# Patient Record
Sex: Female | Born: 1955 | Race: Black or African American | Hispanic: No | Smoking: Never smoker
Health system: Southern US, Community
[De-identification: ages and names within clinical notes are randomized; demographics above are authoritative.]

## PROBLEM LIST (undated history)

## (undated) DIAGNOSIS — F329 Major depressive disorder, single episode, unspecified: Secondary | ICD-10-CM

## (undated) DIAGNOSIS — R569 Unspecified convulsions: Secondary | ICD-10-CM

## (undated) DIAGNOSIS — I10 Essential (primary) hypertension: Secondary | ICD-10-CM

## (undated) DIAGNOSIS — F419 Anxiety disorder, unspecified: Secondary | ICD-10-CM

## (undated) DIAGNOSIS — E785 Hyperlipidemia, unspecified: Secondary | ICD-10-CM

## (undated) DIAGNOSIS — M25569 Pain in unspecified knee: Secondary | ICD-10-CM

## (undated) DIAGNOSIS — J45909 Unspecified asthma, uncomplicated: Secondary | ICD-10-CM

## (undated) DIAGNOSIS — F32A Depression, unspecified: Secondary | ICD-10-CM

## (undated) HISTORY — DX: Major depressive disorder, single episode, unspecified: F32.9

## (undated) HISTORY — DX: Hyperlipidemia, unspecified: E78.5

## (undated) HISTORY — PX: REDUCTION MAMMAPLASTY: SUR839

## (undated) HISTORY — DX: Depression, unspecified: F32.A

## (undated) HISTORY — DX: Anxiety disorder, unspecified: F41.9

## (undated) HISTORY — PX: BREAST SURGERY: SHX581

---

## 2006-08-12 ENCOUNTER — Ambulatory Visit: Payer: Self-pay | Admitting: Family Medicine

## 2006-08-15 ENCOUNTER — Ambulatory Visit: Payer: Self-pay | Admitting: Internal Medicine

## 2006-08-17 ENCOUNTER — Ambulatory Visit: Payer: Self-pay | Admitting: Nurse Practitioner

## 2006-08-18 ENCOUNTER — Ambulatory Visit: Payer: Self-pay | Admitting: *Deleted

## 2006-08-29 ENCOUNTER — Ambulatory Visit: Payer: Self-pay | Admitting: Nurse Practitioner

## 2007-07-26 ENCOUNTER — Encounter (INDEPENDENT_AMBULATORY_CARE_PROVIDER_SITE_OTHER): Payer: Self-pay | Admitting: *Deleted

## 2009-09-11 ENCOUNTER — Emergency Department (HOSPITAL_COMMUNITY): Admission: EM | Admit: 2009-09-11 | Discharge: 2009-09-11 | Payer: Self-pay | Admitting: Emergency Medicine

## 2009-09-22 ENCOUNTER — Encounter: Payer: Self-pay | Admitting: Physician Assistant

## 2009-10-21 ENCOUNTER — Ambulatory Visit: Payer: Self-pay | Admitting: Physician Assistant

## 2009-10-21 DIAGNOSIS — R569 Unspecified convulsions: Secondary | ICD-10-CM | POA: Insufficient documentation

## 2009-10-27 LAB — CONVERTED CEMR LAB
ALT: 10 units/L (ref 0–35)
Alkaline Phosphatase: 74 units/L (ref 39–117)
Amphetamine Screen, Ur: NEGATIVE
Barbiturate Quant, Ur: NEGATIVE
Basophils Relative: 0 % (ref 0–1)
CO2: 28 meq/L (ref 19–32)
Cocaine Metabolites: NEGATIVE
HCT: 44.2 % (ref 36.0–46.0)
Hemoglobin: 14.5 g/dL (ref 12.0–15.0)
Lymphs Abs: 3.1 10*3/uL (ref 0.7–4.0)
MCV: 92.7 fL (ref 78.0–100.0)
Opiate Screen, Urine: NEGATIVE
Platelets: 284 10*3/uL (ref 150–400)
Potassium: 4.5 meq/L (ref 3.5–5.3)
RDW: 12.8 % (ref 11.5–15.5)
Sodium: 142 meq/L (ref 135–145)
TSH: 1.446 microintl units/mL (ref 0.350–4.500)
Total Bilirubin: 0.3 mg/dL (ref 0.3–1.2)
Total Protein: 8.2 g/dL (ref 6.0–8.3)

## 2009-11-23 ENCOUNTER — Telehealth: Payer: Self-pay | Admitting: Physician Assistant

## 2009-11-24 ENCOUNTER — Ambulatory Visit: Payer: Self-pay | Admitting: Physician Assistant

## 2009-11-25 LAB — CONVERTED CEMR LAB
BUN: 15 mg/dL (ref 6–23)
CO2: 30 meq/L (ref 19–32)
Calcium: 10.2 mg/dL (ref 8.4–10.5)
Creatinine, Ser: 0.86 mg/dL (ref 0.40–1.20)
Glucose, Bld: 142 mg/dL — ABNORMAL HIGH (ref 70–99)

## 2009-11-27 ENCOUNTER — Encounter: Payer: Self-pay | Admitting: Physician Assistant

## 2009-12-04 ENCOUNTER — Ambulatory Visit (HOSPITAL_COMMUNITY): Admission: RE | Admit: 2009-12-04 | Discharge: 2009-12-04 | Payer: Self-pay | Admitting: Internal Medicine

## 2009-12-09 ENCOUNTER — Encounter: Payer: Self-pay | Admitting: Physician Assistant

## 2009-12-10 ENCOUNTER — Encounter: Payer: Self-pay | Admitting: Physician Assistant

## 2009-12-10 ENCOUNTER — Ambulatory Visit: Payer: Self-pay | Admitting: Physician Assistant

## 2009-12-11 ENCOUNTER — Encounter: Admission: RE | Admit: 2009-12-11 | Discharge: 2009-12-11 | Payer: Self-pay | Admitting: Internal Medicine

## 2009-12-17 ENCOUNTER — Telehealth: Payer: Self-pay | Admitting: Physician Assistant

## 2010-01-06 ENCOUNTER — Telehealth: Payer: Self-pay | Admitting: Physician Assistant

## 2010-02-23 ENCOUNTER — Encounter: Payer: Self-pay | Admitting: Physician Assistant

## 2010-08-26 ENCOUNTER — Emergency Department (HOSPITAL_COMMUNITY): Admission: EM | Admit: 2010-08-26 | Discharge: 2010-08-26 | Payer: Self-pay | Admitting: Emergency Medicine

## 2010-12-08 NOTE — Assessment & Plan Note (Signed)
Summary: seizures/BMET/HIV TEST////cns   Vital Signs:  Patient profile:   55 year old female Height:      65 inches Weight:      207 pounds BMI:     34.57 Temp:     97.8 degrees F oral Pulse rate:   76 / minute Pulse rhythm:   regular Resp:     18 per minute BP sitting:   138 / 78  (left arm) Cuff size:   large  Vitals Entered By: Armenia Shannon (November 24, 2009 3:55 PM) CC: f/u on seiaures.... pt does need blood work...Marland KitchenMarland Kitchen pt says everytime her seizure meds change she gets really dizzy with blurry vision... pt says when the med gets in her system she is fine after a while... pt just wanted to let you know.. Is Patient Diabetic? No Pain Assessment Patient in pain? no       Does patient need assistance? Functional Status Self care Ambulation Normal   CC:  f/u on seiaures.... pt does need blood work...Marland KitchenMarland Kitchen pt says everytime her seizure meds change she gets really dizzy with blurry vision... pt says when the med gets in her system she is fine after a while... pt just wanted to let you know...  History of Present Illness: Here for f/u. No seizures since last visit.  Taking all meds.   [facility]  Wants to switch to our pharm. She was told she could get Depakote ER for free but does not want to change.  She feels dizzy and lethargic whenever she changes her meds. No complaints today. She thinks she had a pap last year in [place].  No mammo in over a year.  She would like to get updated vaccines today. Creatinine last time was a little high.  She is on HCTZ for BP.  Current Medications (verified): 1)  Depakote 500 Mg Tbec (Divalproex Sodium) .... Take 1 Tablet By Mouth Two Times A Day 2)  Tegretol Xr 400 Mg Xr12h-Tab (Carbamazepine) .... Take 1 Tablet By Mouth Two Times A Day 3)  Hydrochlorothiazide 25 Mg Tabs (Hydrochlorothiazide) .... Take 1 Tablet By Mouth Once A Day  Allergies (verified): No Known Drug Allergies  Physical Exam  General:  alert,  well-developed, and well-nourished.   Head:  normocephalic and atraumatic.   Lungs:  normal breath sounds, no crackles, and no wheezes.   Heart:  normal rate and regular rhythm.   Neurologic:  alert & oriented X3 and cranial nerves II-XII intact.   Psych:  normally interactive.     Impression & Recommendations:  Problem # 1:  PREVENTIVE HEALTH CARE (ICD-V70.0)  get record of pap schedule mammo update vaccines schedule CPE/CPP  Orders: T-HIV Antibody  (Reflex) ([id number]) Mammogram (Screening) (Mammo)  Problem # 2:  HYPERTENSION (ICD-401.9)  creat sl high last time check bmet if up more, consider changing med  Her updated medication list for this problem includes:    Hydrochlorothiazide 25 Mg Tabs (Hydrochlorothiazide) .Marland Kitchen... Take 1 tablet by mouth once a day  Orders: T-Basic Metabolic Panel [phone])  Problem # 3:  SEIZURE DISORDER (ICD-780.39)  stable no seizures does not want to change meds wants to go to our pharmacy for meds will fax Rx  Her updated medication list for this problem includes:    Depakote 500 Mg Tbec (Divalproex sodium) .Marland Kitchen... Take 1 tablet by mouth two times a day    Tegretol Xr 400 Mg Xr12h-tab (Carbamazepine) .Marland Kitchen... Take 1 tablet by mouth two times a day  Orders: T-Tegretol (Carbamazepine) [phone]) T-Valproic Acid (Depakene) ([id number])  Complete Medication List: 1)  Depakote 500 Mg Tbec (Divalproex sodium) .... Take 1 tablet by mouth two times a day 2)  Tegretol Xr 400 Mg Xr12h-tab (Carbamazepine) .... Take 1 tablet by mouth two times a day 3)  Hydrochlorothiazide 25 Mg Tabs (Hydrochlorothiazide) .... Take 1 tablet by mouth once a day  Patient Instructions: 1)  Flu shot today. 2)  Tetanus shot today. 3)  Sign form to get records from [facility] in [place], [place].  Need copy of last pap smear. 4)  Please schedule a follow-up appointment in 3 months for CPE.  Prescriptions: DEPAKOTE 500 MG TBEC (DIVALPROEX  SODIUM) Take 1 tablet by mouth two times a day  #60 x 5   Entered and Authorized by:   Tereso Newcomer PA-C   Signed by:   Tereso Newcomer PA-C on 11/24/2009   Method used:   Faxed to ...       [facility] - [facility] (retail)       [address] [place], [place]  [address]       Ph: [phone]       Fax: [phone]   RxID:   [id number] TEGRETOL XR 400 MG XR12H-TAB (CARBAMAZEPINE) Take 1 tablet by mouth two times a day  #60 x 5   Entered and Authorized by:   Tereso Newcomer PA-C   Signed by:   Tereso Newcomer PA-C on 11/24/2009   Method used:   Faxed to ...       [facility] - Pharmac (retail)       [address] [place], [place]  [address]       Ph: [phone]       Fax: [phone]   RxID:   [id number] HYDROCHLOROTHIAZIDE 25 MG TABS (HYDROCHLOROTHIAZIDE) Take 1 tablet by mouth once a day  #30 x 5   Entered and Authorized by:   Tereso Newcomer PA-C   Signed by:   Tereso Newcomer PA-C on 11/24/2009   Method used:   Faxed to ...       [facility] - Pharmac (retail)       [address] [place], [place]  [address]       Ph: [phone]       Fax: [phone]   RxID:   [id number]   Appended Document: seizures/BMET/HIV TEST////cns   Tetanus/Td Vaccine    Vaccine Type: Tdap    Site: left deltoid    Mfr: Sanofi Pasteur    Dose: 0.5 ml    Route: IM    Given by: Armenia Shannon    Exp. Date: 01/29/2012    Lot #: [id number]    VIS given: 09/26/07 version given November 24, 2009.  Pneumovax Vaccine    Vaccine Type: Pneumovax    Site: left deltoid    Mfr: Merck    Dose: 0.5 ml    Route: IM    Given by: Armenia Shannon    Exp. Date: 12/05/2010    Lot #: [id number]    VIS given: 06/05/96 version given November 24, 2009.   Appended Document: seizures/BMET/HIV TEST////cns  Laboratory Results  Date/Time Received: November 24, 2009 5:00 PM   Other Tests  Rapid HIV:  negative

## 2010-12-08 NOTE — Letter (Signed)
Summary: [facility]   MAP   Imported By: Arta Bruce 01/09/2010 15:18:28  _____________________________________________________________________  External Attachment:    Type:   Image     Comment:   External Document

## 2010-12-08 NOTE — Progress Notes (Signed)
   Phone Note Call from Patient Call back at Home Phone [phone]   Reason for Call: Referral Summary of Call: the pt has some problem with her right ankle ( hardly can walk) and she wants to know if the provder can suggest her something over the counter. [facility] [phone] Alben Spittle PA-c Initial call taken by: Manon Hilding,  January 06, 2010 9:25 AM  Follow-up for Phone Call        spoke with pt and she fell down and think she sprung her leg.... pt says she has been putting pressure to it and been soaking her foot.... pt says she has been trying to walk on the foot alittle and elevating the foot... pt says she fell last night... pt would like to know what else she can do to help Follow-up by: Armenia Shannon,  January 06, 2010 2:14 PM  Additional Follow-up for Phone Call Additional follow up Details #1::        RICE R - rest I - ice C - compression  E- elevation  Tylenol or NSAIDs as needed   If she cannot bear weight, she should be seen.  May need an xray. Additional Follow-up by: Tereso Newcomer PA-C,  January 06, 2010 2:33 PM    Additional Follow-up for Phone Call Additional follow up Details #2::    pt is aware and has appt Follow-up by: Armenia Shannon,  January 06, 2010 2:38 PM

## 2010-12-08 NOTE — Progress Notes (Signed)
   Phone Note Outgoing Call   Summary of Call: Please check with breast center. Rec'd ultrasound and mammo reports and note she needs f/u in 6 mos.  Ask if they will handle f/u or if we need to do anything.  Initial call taken by: Tereso Newcomer PA-C,  December 17, 2009 5:54 PM  Follow-up for Phone Call        spoke with [facility] and they said they always send out reminders Follow-up by: Armenia Shannon,  December 18, 2009 12:59 PM

## 2010-12-08 NOTE — Progress Notes (Signed)
Summary: ? change to depakote ER to get free from [facility] at [facility]   Phone Note Other Incoming   Summary of Call: Rec'd correspondence from [facility].  Pt can get Depakote ER for free thru [facility].  Pt hesitant to change.  Can get IR from [facility] pharm.  Will d/w pt. at f/u OV. Initial call taken by: Tereso Newcomer PA-C,  November 23, 2009 11:56 AM

## 2011-07-28 ENCOUNTER — Other Ambulatory Visit: Payer: Self-pay | Admitting: Neurosurgery

## 2011-07-28 DIAGNOSIS — M545 Low back pain, unspecified: Secondary | ICD-10-CM

## 2011-07-31 ENCOUNTER — Other Ambulatory Visit: Payer: Self-pay

## 2012-11-24 ENCOUNTER — Ambulatory Visit
Admission: RE | Admit: 2012-11-24 | Discharge: 2012-11-24 | Disposition: A | Discharge status: Home / Self Care | Payer: Worker's Compensation | Source: Ambulatory Visit | Attending: Occupational Medicine | Admitting: Occupational Medicine

## 2012-11-24 ENCOUNTER — Other Ambulatory Visit: Payer: Self-pay | Admitting: Occupational Medicine

## 2012-11-24 DIAGNOSIS — W19XXXA Unspecified fall, initial encounter: Secondary | ICD-10-CM

## 2013-01-23 ENCOUNTER — Other Ambulatory Visit: Payer: Self-pay

## 2013-01-23 DIAGNOSIS — Z1231 Encounter for screening mammogram for malignant neoplasm of breast: Secondary | ICD-10-CM

## 2013-02-23 ENCOUNTER — Ambulatory Visit: Payer: Self-pay

## 2013-04-12 ENCOUNTER — Ambulatory Visit (HOSPITAL_COMMUNITY)
Admission: RE | Admit: 2013-04-12 | Discharge: 2013-04-12 | Disposition: A | Discharge status: Home / Self Care | Payer: No Typology Code available for payment source | Source: Ambulatory Visit | Attending: Internal Medicine | Admitting: Internal Medicine

## 2013-04-12 ENCOUNTER — Ambulatory Visit: Payer: No Typology Code available for payment source | Attending: Family Medicine | Admitting: Internal Medicine

## 2013-04-12 VITALS — BP 151/87 | HR 70 | Temp 98.8°F | Resp 18 | Wt 200.0 lb

## 2013-04-12 DIAGNOSIS — R112 Nausea with vomiting, unspecified: Secondary | ICD-10-CM | POA: Insufficient documentation

## 2013-04-12 DIAGNOSIS — M1711 Unilateral primary osteoarthritis, right knee: Secondary | ICD-10-CM

## 2013-04-12 DIAGNOSIS — M1710 Unilateral primary osteoarthritis, unspecified knee: Secondary | ICD-10-CM

## 2013-04-12 DIAGNOSIS — M25569 Pain in unspecified knee: Secondary | ICD-10-CM | POA: Insufficient documentation

## 2013-04-12 DIAGNOSIS — I10 Essential (primary) hypertension: Secondary | ICD-10-CM

## 2013-04-12 DIAGNOSIS — IMO0002 Reserved for concepts with insufficient information to code with codable children: Secondary | ICD-10-CM

## 2013-04-12 DIAGNOSIS — W19XXXA Unspecified fall, initial encounter: Secondary | ICD-10-CM | POA: Insufficient documentation

## 2013-04-12 DIAGNOSIS — R197 Diarrhea, unspecified: Secondary | ICD-10-CM

## 2013-04-12 DIAGNOSIS — S8990XA Unspecified injury of unspecified lower leg, initial encounter: Secondary | ICD-10-CM | POA: Insufficient documentation

## 2013-04-12 LAB — COMPREHENSIVE METABOLIC PANEL
ALT: 8 U/L (ref 0–35)
BUN: 11 mg/dL (ref 6–23)
CO2: 27 mEq/L (ref 19–32)
Calcium: 9.8 mg/dL (ref 8.4–10.5)
Chloride: 106 mEq/L (ref 96–112)
Creat: 0.89 mg/dL (ref 0.50–1.10)

## 2013-04-12 LAB — CBC
HCT: 39.3 % (ref 36.0–46.0)
Hemoglobin: 13.2 g/dL (ref 12.0–15.0)
RDW: 13.9 % (ref 11.5–15.5)
WBC: 5.1 10*3/uL (ref 4.0–10.5)

## 2013-04-12 MED ORDER — TRAMADOL HCL 50 MG PO TABS
50.0000 mg | ORAL_TABLET | Freq: Three times a day (TID) | ORAL | Status: DC | PRN
Start: 2013-04-12 — End: 2013-12-31

## 2013-04-12 MED ORDER — ONDANSETRON HCL 4 MG PO TABS: 4.0000 mg | ORAL_TABLET | ORAL | Status: DC | PRN

## 2013-04-12 MED ORDER — DIVALPROEX SODIUM 500 MG PO DR TAB: 500.0000 mg | DELAYED_RELEASE_TABLET | Freq: Two times a day (BID) | ORAL | Status: DC

## 2013-04-12 MED ORDER — CARBAMAZEPINE ER 400 MG PO TB12: 400.0000 mg | ORAL_TABLET | Freq: Two times a day (BID) | ORAL | Status: DC

## 2013-04-12 NOTE — Patient Instructions (Addendum)
Viral Gastroenteritis Viral gastroenteritis is also known as stomach flu. This condition affects the stomach and intestinal tract. It can cause sudden diarrhea and vomiting. The illness typically lasts 3 to 8 days. Most people develop an immune response that eventually gets rid of the virus. While this natural response develops, the virus can make you quite ill. CAUSES  Many different viruses can cause gastroenteritis, such as rotavirus or noroviruses. You can catch one of these viruses by consuming contaminated food or water. You may also catch a virus by sharing utensils or other personal items with an infected person or by touching a contaminated surface. SYMPTOMS  The most common symptoms are diarrhea and vomiting. These problems can cause a severe loss of body fluids (dehydration) and a body salt (electrolyte) imbalance. Other symptoms may include:  Fever.  Headache.  Fatigue.  Abdominal pain. DIAGNOSIS  Your caregiver can usually diagnose viral gastroenteritis based on your symptoms and a physical exam. A stool sample may also be taken to test for the presence of viruses or other infections. TREATMENT  This illness typically goes away on its own. Treatments are aimed at rehydration. The most serious cases of viral gastroenteritis involve vomiting so severely that you are not able to keep fluids down. In these cases, fluids must be given through an intravenous line (IV). HOME CARE INSTRUCTIONS   Drink enough fluids to keep your urine clear or pale yellow. Drink small amounts of fluids frequently and increase the amounts as tolerated.  Ask your caregiver for specific rehydration instructions.  Avoid:  Foods high in sugar.  Alcohol.  Carbonated drinks.  Tobacco.  Juice.  Caffeine drinks.  Extremely hot or cold fluids.  Fatty, greasy foods.  Too much intake of anything at one time.  Dairy products until 24 to 48 hours after diarrhea stops.  You may consume probiotics.  Probiotics are active cultures of beneficial bacteria. They may lessen the amount and number of diarrheal stools in adults. Probiotics can be found in yogurt with active cultures and in supplements.  Wash your hands well to avoid spreading the virus.  Only take over-the-counter or prescription medicines for pain, discomfort, or fever as directed by your caregiver. Do not give aspirin to children. Antidiarrheal medicines are not recommended.  Ask your caregiver if you should continue to take your regular prescribed and over-the-counter medicines.  Keep all follow-up appointments as directed by your caregiver. SEEK IMMEDIATE MEDICAL CARE IF:   You are unable to keep fluids down.  You do not urinate at least once every 6 to 8 hours.  You develop shortness of breath.  You notice blood in your stool or vomit. This may look like coffee grounds.  You have abdominal pain that increases or is concentrated in one small area (localized).  You have persistent vomiting or diarrhea.  You have a fever.  The patient is a child younger than 3 months, and he or she has a fever.  The patient is a child older than 3 months, and he or she has a fever and persistent symptoms.  The patient is a child older than 3 months, and he or she has a fever and symptoms suddenly get worse.  The patient is a baby, and he or she has no tears when crying. MAKE SURE YOU:   Understand these instructions.  Will watch your condition.  Will get help right away if you are not doing well or get worse. Document Released: 10/25/2005 Document Revised: 01/17/2012 Document Reviewed: 08/11/2011   Exit[employer] Patient Information 2014 [employer].  

## 2013-04-12 NOTE — Progress Notes (Signed)
Patient ID: Amanda Roach, female   DOB: 1956-07-26, 57 y.o.   MRN: [id number]   CC: Right knee pain, nausea and vomiting  HPI: Patient is 57 year old female who presents to clinic with 2 concerns. First concern is two-week duration of nausea and nonbloody vomiting, associated with nonbloody diarrhea, intermittent episodes of generalized abdominal discomfort, throbbing, 5/10 in severity when present, no specific alleviating or aggravating factors, nonradiating, no similar events in the past. Patient reports one of her sisters who lives with her was sick approximately one week prior to her symptoms, and was having similar manifestations. Patient denies fevers and chills but explains his, generalized malaise. Patient denies any urinary concerns, no vomiting blood. Patient also reports a six-month history of right knee pain, progressively worse, constant and throbbing, 10 out of 10 in severity, worse with ambulation, associated with some swelling. Patient has not tried any medications over-the-counter but says ambulation makes it worse. She denies similar symptoms in the left knee  No Known Allergies No past medical history on file. No current outpatient prescriptions on file prior to visit.   No current facility-administered medications on file prior to visit.   No familiy history of heart disease  History   Social History  . Marital Status: Divorced    Spouse Name: N/A    Number of Children: N/A  . Years of Education: N/A   Occupational History  . Not on file.   Social History Main Topics  . Smoking status: Not on file  . Smokeless tobacco: Not on file  . Alcohol Use: Not on file  . Drug Use: Not on file  . Sexually Active: Not on file   Other Topics Concern  . Not on file   Social History Narrative  . No narrative on file    Review of Systems  Constitutional: Negative for fever, chills, diaphoresis, activity change.  HENT: Negative for ear pain, nosebleeds, congestion, facial  swelling, rhinorrhea, neck pain, neck stiffness and ear discharge.   Eyes: Negative for pain, discharge, redness, itching and visual disturbance.  Respiratory: Negative for cough, choking, chest tightness, shortness of breath, wheezing and stridor.   Cardiovascular: Negative for chest pain, palpitations and leg swelling.  Gastrointestinal: Negative for abdominal distention.  Genitourinary: Negative for dysuria, urgency, frequency, hematuria, flank pain, decreased urine volume, difficulty urinating and dyspareunia.  Musculoskeletal: Negative for back pain.  Neurological: Negative for dizziness, tremors, seizures, syncope, facial asymmetry, speech difficulty, weakness, light-headedness, numbness and headaches.  Hematological: Negative for adenopathy. Does not bruise/bleed easily.  Psychiatric/Behavioral: Negative for hallucinations, behavioral problems, confusion, dysphoric mood, decreased concentration and agitation.    Objective:   Filed Vitals:   04/12/13 1100  BP: 151/87  Pulse: 70  Temp: 98.8 F (37.1 C)  Resp: 18    Physical Exam  Constitutional: Appears well-developed and well-nourished. No distress.  HENT: Normocephalic. External right and left ear normal. Oropharynx is clear and moist.  Eyes: Conjunctivae and EOM are normal. PERRLA, no scleral icterus.  Neck: Normal ROM. Neck supple. No JVD. No tracheal deviation. No thyromegaly.  CVS: RRR, S1/S2 +, no murmurs, no gallops, no carotid bruit.  Pulmonary: Effort and breath sounds normal, no stridor, rhonchi, wheezes, rales.  Abdominal: Soft. BS +,  no distension, tenderness in epigastric area, no rebound or guarding.  Musculoskeletal: Normal range of motion. Significant tenderness around right knee area, mild swelling anteriorly, no erythema and no warmth to touch  Lymphadenopathy: No lymphadenopathy noted, cervical, inguinal. Neuro: Alert. Normal reflexes, muscle tone coordination.  No cranial nerve deficit. Skin: Skin is warm  and dry. No rash noted. Not diaphoretic. No erythema. No pallor.  Psychiatric: Normal mood and affect. Behavior, judgment, thought content normal.   Lab Results  Component Value Date   WBC 6.4 10/21/2009   HGB 14.5 10/21/2009   HCT 44.2 10/21/2009   MCV 92.7 10/21/2009   PLT 284 10/21/2009   Lab Results  Component Value Date   CREATININE 0.86 11/24/2009   BUN 15 11/24/2009   NA 139 11/24/2009   K 4.8 11/24/2009   CL 100 11/24/2009   CO2 30 11/24/2009    No results found for this basename: HGBA1C   Lipid Panel  No results found for this basename: chol, trig, hdl, cholhdl, vldl, ldlcalc       Assessment and plan:   Patient Active Problem List   Diagnosis Date Noted  . knee PAIN, RIGHT - unclear etiology, will send patient to radiology for x-ray imaging, will prescribe tramadol for symptomatic relief, patient advised to call us back if her symptoms do not improve within next 1-2 weeks, if imaging shows any acute findings will call patient back and she was made aware of that  12/10/2009  .  abdominal discomfort with nausea, vomiting, diarrhea  - this appears to be viral in etiology, will prescribe Zofran for symptom relief, I will also check electrolyte panel and CBC, if there is any suspicion of diverticulitis will need to bring patient in and refer for CT abdomen and pelvis for further evaluation. I have discussed this with patient and she has agreed with my plan. I will defer on CT of the abdomen at this time as I am not clear if her kidney function is stable for contrast administration. 10/21/2009  . SEIZURE DISORDER - will provide refill on her medicine  10/21/2009

## 2013-04-12 NOTE — Progress Notes (Signed)
Patient states fell and hurt her right knee Also complains of nausea Headache Diarrhea Fever and vomited last evening

## 2013-04-18 ENCOUNTER — Ambulatory Visit: Payer: Self-pay

## 2013-04-23 ENCOUNTER — Other Ambulatory Visit (HOSPITAL_COMMUNITY)
Admission: RE | Admit: 2013-04-23 | Discharge: 2013-04-23 | Disposition: A | Discharge status: Home / Self Care | Payer: No Typology Code available for payment source | Source: Ambulatory Visit | Attending: Emergency Medicine | Admitting: Emergency Medicine

## 2013-04-23 ENCOUNTER — Encounter (HOSPITAL_COMMUNITY): Payer: Self-pay | Admitting: Emergency Medicine

## 2013-04-23 ENCOUNTER — Emergency Department (HOSPITAL_COMMUNITY)
Admission: EM | Admit: 2013-04-23 | Discharge: 2013-04-23 | Disposition: A | Discharge status: Nursing Home (Includes ICF) | Payer: No Typology Code available for payment source | Source: Home / Self Care | Attending: Emergency Medicine | Admitting: Emergency Medicine

## 2013-04-23 ENCOUNTER — Emergency Department (HOSPITAL_COMMUNITY)
Admission: EM | Admit: 2013-04-23 | Discharge: 2013-04-23 | Disposition: A | Discharge status: Home / Self Care | Payer: No Typology Code available for payment source | Attending: Emergency Medicine | Admitting: Emergency Medicine

## 2013-04-23 ENCOUNTER — Emergency Department (INDEPENDENT_AMBULATORY_CARE_PROVIDER_SITE_OTHER): Payer: No Typology Code available for payment source

## 2013-04-23 DIAGNOSIS — I209 Angina pectoris, unspecified: Secondary | ICD-10-CM

## 2013-04-23 DIAGNOSIS — R109 Unspecified abdominal pain: Secondary | ICD-10-CM

## 2013-04-23 DIAGNOSIS — J45909 Unspecified asthma, uncomplicated: Secondary | ICD-10-CM | POA: Insufficient documentation

## 2013-04-23 DIAGNOSIS — Z113 Encounter for screening for infections with a predominantly sexual mode of transmission: Secondary | ICD-10-CM | POA: Insufficient documentation

## 2013-04-23 DIAGNOSIS — Z8739 Personal history of other diseases of the musculoskeletal system and connective tissue: Secondary | ICD-10-CM | POA: Insufficient documentation

## 2013-04-23 DIAGNOSIS — R0789 Other chest pain: Secondary | ICD-10-CM

## 2013-04-23 DIAGNOSIS — R197 Diarrhea, unspecified: Secondary | ICD-10-CM | POA: Insufficient documentation

## 2013-04-23 DIAGNOSIS — R0602 Shortness of breath: Secondary | ICD-10-CM | POA: Insufficient documentation

## 2013-04-23 DIAGNOSIS — M545 Low back pain, unspecified: Secondary | ICD-10-CM | POA: Insufficient documentation

## 2013-04-23 DIAGNOSIS — Z79899 Other long term (current) drug therapy: Secondary | ICD-10-CM | POA: Insufficient documentation

## 2013-04-23 DIAGNOSIS — N760 Acute vaginitis: Secondary | ICD-10-CM | POA: Insufficient documentation

## 2013-04-23 DIAGNOSIS — I10 Essential (primary) hypertension: Secondary | ICD-10-CM | POA: Insufficient documentation

## 2013-04-23 DIAGNOSIS — Z8669 Personal history of other diseases of the nervous system and sense organs: Secondary | ICD-10-CM | POA: Insufficient documentation

## 2013-04-23 DIAGNOSIS — R112 Nausea with vomiting, unspecified: Secondary | ICD-10-CM | POA: Insufficient documentation

## 2013-04-23 HISTORY — DX: Unspecified asthma, uncomplicated: J45.909

## 2013-04-23 HISTORY — DX: Pain in unspecified knee: M25.569

## 2013-04-23 HISTORY — DX: Unspecified convulsions: R56.9

## 2013-04-23 HISTORY — DX: Essential (primary) hypertension: I10

## 2013-04-23 LAB — CBC WITH DIFFERENTIAL/PLATELET
Basophils Relative: 1 % (ref 0–1)
Eosinophils Absolute: 0.2 10*3/uL (ref 0.0–0.7)
Eosinophils Absolute: 0.1 10*3/uL (ref 0.0–0.7)
HCT: 39.6 % (ref 36.0–46.0)
Hemoglobin: 13.4 g/dL (ref 12.0–15.0)
Hemoglobin: 13.5 g/dL (ref 12.0–15.0)
Lymphocytes Relative: 55 % — ABNORMAL HIGH (ref 12–46)
Lymphs Abs: 2.1 10*3/uL (ref 0.7–4.0)
Lymphs Abs: 2 10*3/uL (ref 0.7–4.0)
MCH: 29.7 pg (ref 26.0–34.0)
MCH: 29.8 pg (ref 26.0–34.0)
MCHC: 34.1 g/dL (ref 30.0–36.0)
MCV: 87.4 fL (ref 78.0–100.0)
Monocytes Absolute: 0.4 10*3/uL (ref 0.1–1.0)
Monocytes Relative: 12 % (ref 3–12)
Monocytes Relative: 12 % (ref 3–12)
Neutrophils Relative %: 28 % — ABNORMAL LOW (ref 43–77)
RBC: 4.51 MIL/uL (ref 3.87–5.11)
RBC: 4.53 MIL/uL (ref 3.87–5.11)

## 2013-04-23 LAB — POCT URINALYSIS DIP (DEVICE)
Glucose, UA: NEGATIVE mg/dL
Ketones, ur: 15 mg/dL — AB
Specific Gravity, Urine: >=1.03 (ref 1.005–1.030)
Urobilinogen, UA: 0.2 mg/dL (ref 0.0–1.0)

## 2013-04-23 LAB — COMPREHENSIVE METABOLIC PANEL
ALT: 7 U/L (ref 0–35)
Albumin: 3.9 g/dL (ref 3.5–5.2)
Alkaline Phosphatase: 70 U/L (ref 39–117)
BUN: 7 mg/dL (ref 6–23)
Chloride: 103 mEq/L (ref 96–112)
Potassium: 3.9 mEq/L (ref 3.5–5.1)
Sodium: 142 mEq/L (ref 135–145)
Total Bilirubin: 0.2 mg/dL — ABNORMAL LOW (ref 0.3–1.2)

## 2013-04-23 LAB — URINALYSIS, ROUTINE W REFLEX MICROSCOPIC
Glucose, UA: NEGATIVE mg/dL
Ketones, ur: 15 mg/dL — AB
Nitrite: NEGATIVE
Specific Gravity, Urine: 1.023 (ref 1.005–1.030)
pH: 5.5 (ref 5.0–8.0)

## 2013-04-23 LAB — POCT I-STAT, CHEM 8
BUN: 5 mg/dL — ABNORMAL LOW (ref 6–23)
Creatinine, Ser: 0.8 mg/dL (ref 0.50–1.10)
Hemoglobin: 14.6 g/dL (ref 12.0–15.0)
Potassium: 3.9 mEq/L (ref 3.5–5.1)
Sodium: 141 mEq/L (ref 135–145)

## 2013-04-23 LAB — URINE MICROSCOPIC-ADD ON

## 2013-04-23 LAB — LIPASE, BLOOD: Lipase: 19 U/L (ref 11–59)

## 2013-04-23 MED ORDER — ONDANSETRON 4 MG PO TBDP
ORAL_TABLET | ORAL | Status: AC
Start: 2013-04-23 — End: 2013-04-23
  Filled 2013-04-23: qty 2

## 2013-04-23 MED ORDER — OLMESARTAN MEDOXOMIL-HCTZ 20-12.5 MG PO TABS: 1.0000 | ORAL_TABLET | Freq: Every day | ORAL | Status: DC

## 2013-04-23 MED ORDER — GI COCKTAIL ~~LOC~~
ORAL | Status: AC
  Filled 2013-04-23: qty 30

## 2013-04-23 MED ORDER — GI COCKTAIL ~~LOC~~
30.0000 mL | Freq: Once | ORAL | Status: AC
  Administered 2013-04-23: 30 mL via ORAL

## 2013-04-23 MED ORDER — ONDANSETRON 4 MG PO TBDP
8.0000 mg | ORAL_TABLET | Freq: Once | ORAL | Status: AC
  Administered 2013-04-23: 8 mg via ORAL

## 2013-04-23 NOTE — ED Provider Notes (Signed)
Chief Complaint:   Chief Complaint  Patient presents with  . Abdominal Pain    History of Present Illness:   Amanda Roach is a 57 year old female who presents today with multiple symptoms including chest pain, abdominal pain, vomiting, and diarrhea.  1. Chest pain: This began about 2 weeks ago. Episodes last about 45 minutes and nonexertional. She's had 3 episodes in the last 2 weeks. It feels like "an air conditioner sitting on my chest.". It's been associated with difficulty breathing but no nausea or diaphoresis. She denies any previous cardiac history. She does have a history of high blood pressure but no diabetes, elevated cholesterol, or cigarette smoking.  2. Abdominal pain: This began a week and a half ago. The patient describes severe pain in the left upper quadrant and the lower abdomen bilaterally with radiation through to the back. The pain is constant and it feels like a charming feeling. It's worse if she is or drinks anything and better with Pepto-Bismol. The pain is rated a 10 over 10 in intensity. Also for the past week and a half she's had vomiting. She estimates she vomited about 3 times in the past week and a half. No hematemesis or coffee-ground emesis, but she has had some bilious emesis. Also for the past week and half she's had diarrhea with loose, black to green stools without blood. She has a number of other symptoms including feeling feverish, aching in her ears, popping or ears, headache, nasal congestion, cough, and shortness of breath. She denies any urinary or GYN complaints. Her last menstrual period was 4 years ago. She denies any history of ulcer disease. She was seen at [facility] about 10 days ago. She had a CBC and complete metabolic panel which were normal. She was given medication for nausea, but does not feel any better.  Review of Systems:  Other than noted above, the patient denies any of the following symptoms. Systemic:  No fever,  chills, sweats, or fatigue. ENT:  No nasal congestion, rhinorrhea, or sore throat. Pulmonary:  No cough, wheezing, shortness of breath, sputum production, hemoptysis. Cardiac:  No palpitations, rapid heartbeat, dizziness, presyncope or syncope. GI:  No abdominal pain, heartburn, nausea, or vomiting. Ext:  No leg pain or swelling.  PMFSH:  Past medical history, family history, social history, meds, and allergies were reviewed and updated as needed. She has high blood pressure and epilepsy. She takes Tegretol and Depakote as well as Benicar/HCTZ.  Physical Exam:   Vital signs:  BP 140/77  Pulse 63  Temp(Src) 98.6 F (37 C) (Oral)  Resp 16  SpO2 99% Gen:  Alert, oriented, in no distress, skin warm and dry. Eye:  PERRL, lids and conjunctivas normal.  Sclera non-icteric. ENT:  Mucous membranes moist, pharynx clear. Neck:  Supple, no adenopathy or tenderness.  No JVD. Lungs:  Clear to auscultation, no wheezes, rales or rhonchi.  No respiratory distress. Heart:  Regular rhythm.  No gallops, murmers, clicks or rubs. Chest:  No chest wall tenderness. Abdomen:  Soft, flat, nondistended. She has tenderness with guarding in the epigastrium and the left lower quadrant. There is no organomegaly or mass. Bowel sounds are normally active. Pelvic: Normal external genitalia, vaginal and cervical mucosa were normal. No discharge or bleeding. She has moderate pain on cervical motion. Uterus is normal in size and shape and is moderately tender. She has severe bilateral adnexal tenderness without a mass. Ext:  No edema.  No calf tenderness and Homann's  sign negative.  Pulses full and equal. Skin:  Warm and dry.  No rash.  Labs:   Results for orders placed during the hospital encounter of 04/23/13  CBC WITH DIFFERENTIAL      Result Value Range   WBC 3.8 (*) 4.0 - 10.5 K/uL   RBC 4.51  3.87 - 5.11 MIL/uL   Hemoglobin 13.4  12.0 - 15.0 g/dL   HCT 45.4  09.8 - 11.9 %   MCV 86.9  78.0 - 100.0 fL   MCH 29.7   26.0 - 34.0 pg   MCHC 34.2  30.0 - 36.0 g/dL   RDW 14.7  82.9 - 56.2 %   Platelets 211  150 - 400 K/uL   Neutrophils Relative % 28 (*) 43 - 77 %   Neutro Abs 1.1 (*) 1.7 - 7.7 K/uL   Lymphocytes Relative 55 (*) 12 - 46 %   Lymphs Abs 2.1  0.7 - 4.0 K/uL   Monocytes Relative 12  3 - 12 %   Monocytes Absolute 0.5  0.1 - 1.0 K/uL   Eosinophils Relative 5  0 - 5 %   Eosinophils Absolute 0.2  0.0 - 0.7 K/uL   Basophils Relative 1  0 - 1 %   Basophils Absolute 0.0  0.0 - 0.1 K/uL  POCT URINALYSIS DIP (DEVICE)      Result Value Range   Glucose, UA NEGATIVE  NEGATIVE mg/dL   Bilirubin Urine SMALL (*) NEGATIVE   Ketones, ur 15 (*) NEGATIVE mg/dL   Specific Gravity, Urine >=1.030  1.005 - 1.030   Hgb urine dipstick TRACE (*) NEGATIVE   pH 5.5  5.0 - 8.0   Protein, ur 30 (*) NEGATIVE mg/dL   Urobilinogen, UA 0.2  0.0 - 1.0 mg/dL   Nitrite NEGATIVE  NEGATIVE   Leukocytes, UA TRACE (*) NEGATIVE  POCT I-STAT, CHEM 8      Result Value Range   Sodium 141  135 - 145 mEq/L   Potassium 3.9  3.5 - 5.1 mEq/L   Chloride 107  96 - 112 mEq/L   BUN 5 (*) 6 - 23 mg/dL   Creatinine, Ser 1.30  0.50 - 1.10 mg/dL   Glucose, Bld 95  70 - 99 mg/dL   Calcium, Ion 8.65  7.84 - 1.23 mmol/L   TCO2 27  0 - 100 mmol/L   Hemoglobin 14.6  12.0 - 15.0 g/dL   HCT 69.6  29.5 - 28.4 %     Radiology:  Dg Abd Acute W/chest  04/23/2013   *RADIOLOGY REPORT*  Clinical Data: Generalized abdominal pain for 2 weeks.  Nausea, vomiting and diarrhea.  ACUTE ABDOMEN SERIES (ABDOMEN 2 VIEW & CHEST 1 VIEW)  Comparison: None.  Findings: The heart size and mediastinal contours are normal. The lungs are clear. There is no pleural effusion or pneumothorax. No acute osseous findings are identified.  The bowel gas pattern is normal.  There is no free intraperitoneal air.  Small pelvic calcifications are likely phleboliths.  No acute osseous findings are evident.  IMPRESSION: No evidence of active cardiopulmonary or abdominal process.    Original Report Authenticated By: Carey Bullocks, M.D.   I reviewed the images independently and personally and concur with the radiologist's findings.  EKG:   Date: 04/23/2013  Rate: 74  Rhythm: normal sinus rhythm  QRS Axis: normal  Intervals: normal  ST/T Wave abnormalities: nonspecific T wave changes  Conduction Disutrbances:none  Narrative Interpretation: She has T wave inversions all across her  precordial leads consistent with anterior ischemia.  Old EKG Reviewed: none available  Course in Urgent Care Center:   She was given a dose of GI cocktail and Zofran ODT 8 mg sublingually and felt a little bit better. She did not have any chest pain while she was in the urgent care Center.  Assessment:  The primary encounter diagnosis was Angina pectoris. A diagnosis of Abdominal pain was also pertinent to this visit.  Her chest pain may be due to angina. The differential diagnosis for her abdominal pain is extensive including viral or bacterial gastroenteritis, colitis, diverticulitis, stomach ulcer, PID, or pancreatitis. She will need further workup for both cardiac issues and abdominal pain.   Plan:   1.  The following meds were prescribed:   New Prescriptions   No medications on file   2.  The patient was transferred to the emergency department via shuttle in stable condition.   Reuben Likes, MD 04/23/13 1225

## 2013-04-23 NOTE — ED Notes (Signed)
Abdominal pain onset 2 weeks ago.  Stomach pain is the worst pain.  Intermittent chest pain "like someone sitting on chest".  Nausea /vomiting/diarrhea is intermittent.  Reports she is drinking liquids, but when trying to eat solids, it will sit in chest and finally go down.  Also reports headache and sore throat.  [facility].

## 2013-04-23 NOTE — ED Notes (Signed)
Pt states she has had this cp before and went to pcp they did nothing for it.

## 2013-04-23 NOTE — ED Notes (Signed)
Pt called x2 for triaeg no answer.

## 2013-04-23 NOTE — ED Notes (Signed)
Assisted patient with calling a friend, but patient unable to remember number. Explained to patient why no beverages offered to her, unsure what tests would be ordered in ed.

## 2013-04-23 NOTE — ED Notes (Signed)
Cp for a long time  X 1 year  Comes and goes and abd pain x 2 weeks ago  States havent eaten a lot latlly having diarrhea

## 2013-04-23 NOTE — ED Notes (Signed)
Discharge instructions reviewed. Pt verbalized understanding.  

## 2013-04-23 NOTE — ED Notes (Signed)
Pt c/o upper cp with no radiation and abd pain. Pt states she recently had a stomach virus and had n/v/d. Pt states she has not had any diarrhea or vomiting in the last 24hrs. Pt describes pain in chest as a weight with intermittent sharp pains. Pt has hx of HTN but has not taken medications since January because she felt her pressure was better. Pt unable to describe abd pain but rates it 7/10. Pt states she has not eaten anything except "tater tots, and popsicles".

## 2013-04-24 LAB — URINE CULTURE: Colony Count: NO GROWTH

## 2013-04-24 NOTE — ED Provider Notes (Signed)
History     CSN: [id number]  Arrival date & time 04/23/13  1236   First MD Initiated Contact with Patient 04/23/13 1538      Chief Complaint  Patient presents with  . Chest Pain  . Abdominal Pain    (Consider location/radiation/quality/duration/timing/severity/associated sxs/prior treatment) HPI Comments: 57 y.o. Female with PMHx of HTN, seizures, asthma presents today from Urgent Care complaining of abdominal pain (onset 10 days ago) with associated intermittent nausea, vomiting, and diarrhea as well as chest pain (onset 2 weeks ago).   Chest pain:  Severity: Moderate  Onset quality: Gradual  Duration: 2 weeks ago Location: left sided, localized Timing: intermittent Progression: Unchanged  Relieved by: nothing tried Worsened by: Nothing tried  Ineffective treatments: None tried Associated sx: intermittent difficulty breathing (resolved at the moment).   Abdominal pain: Severity: Moderate  Onset quality: Gradual  Duration: 10 days Location: LUQ, LLQ, radiating to back Timing: Intermittent Progression: Unchanged  Relieved by: Nothing tried Worsened by: Nothing tried  Ineffective treatments: None tried Associated sx: nausea, vomiting, diarrhea. Denies dysuria, hematuria, vaginal discharge   Patient is a 57 y.o. female presenting with chest pain and abdominal pain.  Chest Pain Associated symptoms: abdominal pain, back pain, nausea, shortness of breath and vomiting   Associated symptoms: no diaphoresis, no dizziness, no fever, no headache, no numbness, no palpitations and no weakness   Abdominal Pain Associated symptoms include abdominal pain, chest pain, nausea and vomiting. Pertinent negatives include no diaphoresis, fever, headaches, neck pain, numbness, rash or weakness.    Past Medical History  Diagnosis Date  . Seizure   . Hypertension   . Knee pain   . Asthma     No past surgical history on file.  No family history on file.  History  Substance Use  Topics  . Smoking status: Never Smoker   . Smokeless tobacco: Not on file  . Alcohol Use: No    OB History   Grav Para Term Preterm Abortions TAB SAB Ect Mult Living                  Review of Systems  Constitutional: Negative for fever and diaphoresis.  HENT: Negative for neck pain and neck stiffness.   Eyes: Negative for visual disturbance.  Respiratory: Positive for shortness of breath. Negative for apnea and chest tightness.        Resolved at the moment  Cardiovascular: Positive for chest pain. Negative for palpitations.       Left sided  Gastrointestinal: Positive for nausea, vomiting, abdominal pain and diarrhea. Negative for constipation and blood in stool.       Left sided, both upper and lower quadrants  Genitourinary: Negative for dysuria, hematuria, flank pain, vaginal bleeding and vaginal discharge.  Musculoskeletal: Positive for back pain. Negative for gait problem.       Left lumbar  Skin: Negative for rash.  Neurological: Negative for dizziness, weakness, light-headedness, numbness and headaches.    Allergies  Review of patient's allergies indicates no known allergies.  Home Medications   Current Outpatient Rx  Name  Route  Sig  Dispense  Refill  . carbamazepine (TEGRETOL XR) 400 MG 12 hr tablet   Oral   Take 800 mg by mouth at bedtime.         . divalproex (DEPAKOTE) 500 MG DR tablet   Oral   Take 1,000 mg by mouth every morning.         Marland Kitchen ibuprofen (ADVIL,MOTRIN) 800 MG tablet  Oral   Take 800 mg by mouth 3 (three) times daily as needed for pain. For knee pain         . olmesartan-hydrochlorothiazide (BENICAR HCT) 20-12.5 MG per tablet   Oral   Take 1 tablet by mouth daily.   30 tablet   3   . ondansetron (ZOFRAN) 4 MG tablet   Oral   Take 1 tablet (4 mg total) by mouth every 4 (four) hours as needed for nausea.   65 tablet   3   . traMADol (ULTRAM) 50 MG tablet   Oral   Take 1 tablet (50 mg total) by mouth every 8 (eight) hours  as needed for pain.   30 tablet   0     BP 157/78  Pulse 66  Temp(Src) 98.4 F (36.9 C)  Resp 21  SpO2 99%  Physical Exam  Nursing note and vitals reviewed. Constitutional: She is oriented to person, place, and time. She appears well-developed and well-nourished. No distress.  HENT:  Head: Normocephalic and atraumatic.  Eyes: Conjunctivae and EOM are normal.  Neck: Normal range of motion. Neck supple.  No meningeal signs  Cardiovascular: Normal rate, regular rhythm, normal heart sounds and intact distal pulses.  Exam reveals no gallop and no friction rub.   No murmur heard. Pulmonary/Chest: Effort normal and breath sounds normal. No respiratory distress. She has no wheezes. She has no rales. She exhibits no tenderness.  Abdominal: Soft. Bowel sounds are normal. She exhibits no distension. There is no tenderness. There is no rebound and no guarding.  Musculoskeletal: Normal range of motion. She exhibits no edema and no tenderness.  FROM to upper and lower extremities  Neurological: She is alert and oriented to person, place, and time. No cranial nerve deficit.  Speech is clear and goal oriented, follows commands Sensation normal to light touch Moves extremities without ataxia, coordination intact Normal gait and balance Normal strength in upper and lower extremities bilaterally including dorsiflexion and plantar flexion, strong and equal grip strength  Skin: Skin is warm and dry. She is not diaphoretic. No erythema.  Psychiatric:  anxious    ED Course  Procedures (including critical care time)  Labs Reviewed  COMPREHENSIVE METABOLIC PANEL - Abnormal; Notable for the following:    Total Bilirubin 0.2 (*)    GFR calc non Af Amer 64 (*)    GFR calc Af Amer 74 (*)    All other components within normal limits  URINALYSIS, ROUTINE W REFLEX MICROSCOPIC - Abnormal; Notable for the following:    Hgb urine dipstick LARGE (*)    Bilirubin Urine SMALL (*)    Ketones, ur 15 (*)     Leukocytes, UA TRACE (*)    All other components within normal limits  CBC WITH DIFFERENTIAL - Abnormal; Notable for the following:    WBC 3.7 (*)    Neutrophils Relative % 30 (*)    Neutro Abs 1.1 (*)    Lymphocytes Relative 54 (*)    All other components within normal limits  URINE MICROSCOPIC-ADD ON - Abnormal; Notable for the following:    Squamous Epithelial / LPF FEW (*)    Bacteria, UA FEW (*)    All other components within normal limits  URINE CULTURE  LIPASE, BLOOD  POCT I-STAT TROPONIN I   Dg Abd Acute W/chest  04/23/2013   *RADIOLOGY REPORT*  Clinical Data: Generalized abdominal pain for 2 weeks.  Nausea, vomiting and diarrhea.  ACUTE ABDOMEN SERIES (ABDOMEN 2 VIEW &  CHEST 1 VIEW)  Comparison: None.  Findings: The heart size and mediastinal contours are normal. The lungs are clear. There is no pleural effusion or pneumothorax. No acute osseous findings are identified.  The bowel gas pattern is normal.  There is no free intraperitoneal air.  Small pelvic calcifications are likely phleboliths.  No acute osseous findings are evident.  IMPRESSION: No evidence of active cardiopulmonary or abdominal process.   Original Report Authenticated By: Carey Bullocks, M.D.    Date: 04/24/2013  Rate: 63  Rhythm: normal sinus rhythm  QRS Axis: normal  Intervals: normal  ST/T Wave abnormalities: nonspecific T wave changes  Conduction Disutrbances:low QRS  Narrative Interpretation: abnormal EKG  Old EKG Reviewed: none available    1. Chest pain, non-cardiac   2. Abdominal pain       MDM  No hx of coronary dz. PE is benign, neuro exam is normal, lungs CTA, equal full expansion. Suspicion for ACS or asthma attack is low. Not concerning for pneumothorax, pnuemonia, aortic dissection, PE (Pt denies a history of travel, immobilization, surgery, fevers, cancer, oral contraceptives or hormone use, swelling of the legs. The patient has no history of venous thromboembolis).   EKG without  acute abnormalities, negative troponin, and negative CXR. Labs unconcerning. Discussed with pt that presentation of symptoms, tests and imaging performed today are reassuring to rule out acute coronary syndrome, pneumothorax, aortic dissection, pneumonia, or pulmonary embolism. Pt has been advised to return to the ED is CP becomes exertional, associated with diaphoresis or nausea, radiates to left jaw/arm, worsens or becomes concerning in any way. Pt appears reliable for follow up with PCP and cardiology  Patient is afebrile, nontoxic, nonseptic appearing, in no apparent distress.  Only mild discomfort with palpation. On physical exam patient does not appear to have a surgical abdomen and there are no peritoneal signs.  No indication of appendicitis, bowel obstruction, bowel perforation, cholecystitis, diverticulitis, PID or ectopic pregnancy.   Labs, imaging and vitals reviewed. Values, including lipase, are within normal limits. Patient discharged home with symptomatic treatment and given strict instructions for follow-up with GI doctor.  I have also discussed reasons to return immediately to the ER.  Patient expresses understanding and agrees with plan. .   Case has been discussed with Dr. Judd Lien who agrees with the above plan to discharge.        Glade Nurse, PA-C 04/24/13 1004

## 2013-04-25 NOTE — ED Provider Notes (Signed)
Medical screening examination/treatment/procedure(s) were performed by non-physician practitioner and as supervising physician I was immediately available for consultation/collaboration.  Fremon Zacharia, MD 04/25/13 0701 

## 2013-04-26 ENCOUNTER — Other Ambulatory Visit: Payer: Self-pay | Admitting: Gastroenterology

## 2013-04-26 NOTE — Addendum Note (Signed)
Addended byVida Rigger on: 04/26/2013 02:48 PM   Modules accepted: Orders

## 2013-04-30 ENCOUNTER — Encounter (HOSPITAL_COMMUNITY): Payer: Self-pay | Admitting: Gastroenterology

## 2013-04-30 ENCOUNTER — Other Ambulatory Visit (HOSPITAL_COMMUNITY): Payer: Self-pay | Admitting: Gastroenterology

## 2013-04-30 ENCOUNTER — Encounter (HOSPITAL_COMMUNITY)
Admission: RE | Disposition: A | Discharge status: Home / Self Care | Payer: Self-pay | Source: Ambulatory Visit | Attending: Gastroenterology

## 2013-04-30 ENCOUNTER — Ambulatory Visit (HOSPITAL_COMMUNITY)
Admission: RE | Admit: 2013-04-30 | Discharge: 2013-04-30 | Disposition: A | Discharge status: Home / Self Care | Payer: No Typology Code available for payment source | Source: Ambulatory Visit | Attending: Gastroenterology | Admitting: Gastroenterology

## 2013-04-30 DIAGNOSIS — E119 Type 2 diabetes mellitus without complications: Secondary | ICD-10-CM | POA: Insufficient documentation

## 2013-04-30 DIAGNOSIS — Z79899 Other long term (current) drug therapy: Secondary | ICD-10-CM | POA: Insufficient documentation

## 2013-04-30 DIAGNOSIS — K2960 Other gastritis without bleeding: Secondary | ICD-10-CM | POA: Insufficient documentation

## 2013-04-30 DIAGNOSIS — I10 Essential (primary) hypertension: Secondary | ICD-10-CM | POA: Insufficient documentation

## 2013-04-30 DIAGNOSIS — R1310 Dysphagia, unspecified: Secondary | ICD-10-CM | POA: Insufficient documentation

## 2013-04-30 DIAGNOSIS — K449 Diaphragmatic hernia without obstruction or gangrene: Secondary | ICD-10-CM | POA: Insufficient documentation

## 2013-04-30 DIAGNOSIS — K2940 Chronic atrophic gastritis without bleeding: Secondary | ICD-10-CM | POA: Insufficient documentation

## 2013-04-30 DIAGNOSIS — E669 Obesity, unspecified: Secondary | ICD-10-CM | POA: Insufficient documentation

## 2013-04-30 DIAGNOSIS — Z6834 Body mass index (BMI) 34.0-34.9, adult: Secondary | ICD-10-CM | POA: Insufficient documentation

## 2013-04-30 DIAGNOSIS — R109 Unspecified abdominal pain: Secondary | ICD-10-CM

## 2013-04-30 HISTORY — PX: ESOPHAGOGASTRODUODENOSCOPY: SHX5428

## 2013-04-30 SURGERY — EGD (ESOPHAGOGASTRODUODENOSCOPY)
Anesthesia: Moderate Sedation

## 2013-04-30 MED ORDER — FENTANYL CITRATE 0.05 MG/ML IJ SOLN
INTRAMUSCULAR | Status: AC
  Filled 2013-04-30: qty 4

## 2013-04-30 MED ORDER — FENTANYL CITRATE 0.05 MG/ML IJ SOLN
INTRAMUSCULAR | Status: DC | PRN
  Administered 2013-04-30 (×3): 25 ug via INTRAVENOUS

## 2013-04-30 MED ORDER — MIDAZOLAM HCL 10 MG/2ML IJ SOLN
INTRAMUSCULAR | Status: DC | PRN
  Administered 2013-04-30 (×2): 2.5 mg via INTRAVENOUS
  Administered 2013-04-30: 2 mg via INTRAVENOUS

## 2013-04-30 MED ORDER — BUTAMBEN-TETRACAINE-BENZOCAINE 2-2-14 % EX AERO
INHALATION_SPRAY | CUTANEOUS | Status: DC | PRN
  Administered 2013-04-30: 2 via TOPICAL

## 2013-04-30 MED ORDER — DIPHENHYDRAMINE HCL 50 MG/ML IJ SOLN
INTRAMUSCULAR | Status: AC
  Filled 2013-04-30: qty 1

## 2013-04-30 MED ORDER — SODIUM CHLORIDE 0.9 % IV SOLN
INTRAVENOUS | Status: DC
  Administered 2013-04-30: 12:00:00 via INTRAVENOUS

## 2013-04-30 MED ORDER — MIDAZOLAM HCL 5 MG/ML IJ SOLN
INTRAMUSCULAR | Status: AC
  Filled 2013-04-30: qty 2

## 2013-04-30 NOTE — Op Note (Signed)
[facility] [address] [place], [address]   ENDOSCOPY PROCEDURE REPORT  PATIENT: Amanda, Roach  MR#: [id number] BIRTHDATE: 07/03/1956 , 56  yrs. old GENDER: Female ENDOSCOPIST: Vida Rigger, MD REFERRED BY: PROCEDURE DATE:  04/30/2013 PROCEDURE:  EGD w/ biopsy ASA CLASS:     Class II INDICATIONS:  Epigastric pain.   Nausea.   Dysphagia. MEDICATIONS: Fentanyl 75 mcg IV and Versed 7 mg IV TOPICAL ANESTHETIC: Cetacaine Spray  DESCRIPTION OF PROCEDURE: After the risks benefits and alternatives of the procedure were thoroughly explained, informed consent was obtained.  The Pentax Gastroscope [id number] endoscope was introduced through the mouth and advanced to the second portion of the duodenum. Without limitations.  The instrument was slowly withdrawn as the mucosa was fully examined.findings are recorded below      The scope was then withdrawn from the patient and the procedure completed.  COMPLICATIONS: There were no complications. ENDOSCOPIC IMPRESSION:1. Tiny hiatal hernia 2. Minimal antritis 3. Moderate bulbitis 4. Otherwise within normal limits to the second portion of the duodenum status post gastric biopsy to rule out H. pylori  RECOMMENDATIONS:continue pump inhibitor await pathology and proceed with a CT to make sure no other etiologies for her painand call sooner when necessary  REPEAT EXAM:    as needed  eSigned:  Vida Rigger, MD 04/30/2013 12:47 PM   CC:  PATIENT NAME:  Amanda, Roach MR#: [id number]

## 2013-05-01 ENCOUNTER — Encounter (HOSPITAL_COMMUNITY): Payer: Self-pay | Admitting: Gastroenterology

## 2013-05-04 ENCOUNTER — Ambulatory Visit: Payer: Self-pay

## 2013-05-04 ENCOUNTER — Ambulatory Visit (HOSPITAL_COMMUNITY)
Admission: RE | Admit: 2013-05-04 | Discharge: 2013-05-04 | Disposition: A | Discharge status: Home / Self Care | Payer: No Typology Code available for payment source | Source: Ambulatory Visit | Attending: Gastroenterology | Admitting: Gastroenterology

## 2013-05-04 DIAGNOSIS — R109 Unspecified abdominal pain: Secondary | ICD-10-CM | POA: Insufficient documentation

## 2013-05-04 DIAGNOSIS — Q619 Cystic kidney disease, unspecified: Secondary | ICD-10-CM | POA: Insufficient documentation

## 2013-05-04 DIAGNOSIS — D259 Leiomyoma of uterus, unspecified: Secondary | ICD-10-CM | POA: Insufficient documentation

## 2013-05-04 DIAGNOSIS — K5730 Diverticulosis of large intestine without perforation or abscess without bleeding: Secondary | ICD-10-CM | POA: Insufficient documentation

## 2013-05-04 MED ORDER — IOHEXOL 300 MG/ML  SOLN
100.0000 mL | Freq: Once | INTRAMUSCULAR | Status: AC | PRN
  Administered 2013-05-04: 100 mL via INTRAVENOUS

## 2013-05-09 ENCOUNTER — Ambulatory Visit: Payer: No Typology Code available for payment source | Attending: Family Medicine | Admitting: Family Medicine

## 2013-05-09 VITALS — BP 127/84 | HR 80 | Temp 98.2°F | Resp 18 | Ht 62.0 in | Wt 192.0 lb

## 2013-05-09 DIAGNOSIS — K5900 Constipation, unspecified: Secondary | ICD-10-CM

## 2013-05-09 DIAGNOSIS — K5730 Diverticulosis of large intestine without perforation or abscess without bleeding: Secondary | ICD-10-CM

## 2013-05-09 DIAGNOSIS — K5790 Diverticulosis of intestine, part unspecified, without perforation or abscess without bleeding: Secondary | ICD-10-CM

## 2013-05-09 DIAGNOSIS — M25569 Pain in unspecified knee: Secondary | ICD-10-CM

## 2013-05-09 DIAGNOSIS — K2970 Gastritis, unspecified, without bleeding: Secondary | ICD-10-CM

## 2013-05-09 MED ORDER — ESOMEPRAZOLE MAGNESIUM 40 MG PO CPDR: 40.0000 mg | DELAYED_RELEASE_CAPSULE | Freq: Every day | ORAL | Status: DC

## 2013-05-09 NOTE — Patient Instructions (Addendum)
Constipation, Adult Constipation is when a person has fewer than 3 bowel movements a week; has difficulty having a bowel movement; or has stools that are dry, hard, or larger than normal. As people grow older, constipation is more common. If you try to fix constipation with medicines that make you have a bowel movement (laxatives), the problem may get worse. Long-term laxative use may cause the muscles of the colon to become weak. A low-fiber diet, not taking in enough fluids, and taking certain medicines may make constipation worse. CAUSES   Certain medicines, such as antidepressants, pain medicine, iron supplements, antacids, and water pills.   Certain diseases, such as diabetes, irritable bowel syndrome (IBS), thyroid disease, or depression.   Not drinking enough water.   Not eating enough fiber-rich foods.   Stress or travel.  Lack of physical activity or exercise.  Not going to the restroom when there is the urge to have a bowel movement.  Ignoring the urge to have a bowel movement.  Using laxatives too much. SYMPTOMS   Having fewer than 3 bowel movements a week.   Straining to have a bowel movement.   Having hard, dry, or larger than normal stools.   Feeling full or bloated.   Pain in the lower abdomen.  Not feeling relief after having a bowel movement. DIAGNOSIS  Your caregiver will take a medical history and perform a physical exam. Further testing may be done for severe constipation. Some tests may include:   A barium enema X-ray to examine your rectum, colon, and sometimes, your small intestine.  A sigmoidoscopy to examine your lower colon.  A colonoscopy to examine your entire colon. TREATMENT  Treatment will depend on the severity of your constipation and what is causing it. Some dietary treatments include drinking more fluids and eating more fiber-rich foods. Lifestyle treatments may include regular exercise. If these diet and lifestyle recommendations  do not help, your caregiver may recommend taking over-the-counter laxative medicines to help you have bowel movements. Prescription medicines may be prescribed if over-the-counter medicines do not work.  HOME CARE INSTRUCTIONS   Increase dietary fiber in your diet, such as fruits, vegetables, whole grains, and beans. Limit high-fat and processed sugars in your diet, such as [place] fries, hamburgers, cookies, candies, and soda.   A fiber supplement may be added to your diet if you cannot get enough fiber from foods.   Drink enough fluids to keep your urine clear or pale yellow.   Exercise regularly or as directed by your caregiver.   Go to the restroom when you have the urge to go. Do not hold it.  Only take medicines as directed by your caregiver. Do not take other medicines for constipation without talking to your caregiver first. SEEK IMMEDIATE MEDICAL CARE IF:   You have bright red blood in your stool.   Your constipation lasts for more than 4 days or gets worse.   You have abdominal or rectal pain.   You have thin, pencil-like stools.  You have unexplained weight loss. MAKE SURE YOU:   Understand these instructions.  Will watch your condition.  Will get help right away if you are not doing well or get worse. Document Released: 07/23/2004 Document Revised: 01/17/2012 Document Reviewed: 09/28/2011 [facility] Patient Information 2014 [place], [place].  Diverticulosis Diverticulosis is a common condition that develops when small pouches (diverticula) form in the wall of the colon. The risk of diverticulosis increases with age. It happens more often in people who eat a  low-fiber diet. Most individuals with diverticulosis have no symptoms. Those individuals with symptoms usually experience abdominal pain, constipation, or loose stools (diarrhea). HOME CARE INSTRUCTIONS   Increase the amount of fiber in your diet as directed by your caregiver or dietician. This may reduce  symptoms of diverticulosis.  Your caregiver may recommend taking a dietary fiber supplement.  Drink at least 6 to 8 glasses of water each day to prevent constipation.  Try not to strain when you have a bowel movement.  Your caregiver may recommend avoiding nuts and seeds to prevent complications, although this is still an uncertain benefit.  Only take over-the-counter or prescription medicines for pain, discomfort, or fever as directed by your caregiver. FOODS WITH HIGH FIBER CONTENT INCLUDE:  Fruits. Apple, peach, pear, tangerine, raisins, prunes.  Vegetables. Brussels sprouts, asparagus, broccoli, cabbage, carrot, cauliflower, romaine lettuce, spinach, summer squash, tomato, winter squash, zucchini.  Starchy Vegetables. Baked beans, kidney beans, lima beans, split peas, lentils, potatoes (with skin).  Grains. Whole wheat bread, brown rice, bran flake cereal, plain oatmeal, white rice, shredded wheat, bran muffins. SEEK IMMEDIATE MEDICAL CARE IF:   You develop increasing pain or severe bloating.  You have an oral temperature above 102 F (38.9 C), not controlled by medicine.  You develop vomiting or bowel movements that are bloody or black. Document Released: 07/22/2004 Document Revised: 01/17/2012 Document Reviewed: 03/25/2010 [facility] Patient Information 2014 [place], [place].

## 2013-05-09 NOTE — Progress Notes (Signed)
Patient ID: Amanda Roach, female   DOB: 1956-02-07, 57 y.o.   MRN: [id number]  CC: follow up results  HPI: Patient reports that she is feeling better.  She starting the more.  She had a salad today.  She reports that she still having difficulty with constipation.  She reports that she has had long-term chronic constipation from any years.  She reports that she has not had a very high fiber diet.  She is planning to followup with her gastroenterologist in the next week.  No Known Allergies Past Medical History  Diagnosis Date  . Seizure   . Hypertension   . Knee pain   . Asthma    Current Outpatient Prescriptions on File Prior to Visit  Medication Sig Dispense Refill  . carbamazepine (TEGRETOL XR) 400 MG 12 hr tablet Take 800 mg by mouth at bedtime.      . divalproex (DEPAKOTE) 500 MG DR tablet Take 1,000 mg by mouth every morning.      Marland Kitchen ibuprofen (ADVIL,MOTRIN) 800 MG tablet Take 800 mg by mouth 3 (three) times daily as needed for pain. For knee pain      . olmesartan-hydrochlorothiazide (BENICAR HCT) 20-12.5 MG per tablet Take 1 tablet by mouth daily.  30 tablet  3  . ondansetron (ZOFRAN) 4 MG tablet Take 1 tablet (4 mg total) by mouth every 4 (four) hours as needed for nausea.  65 tablet  3  . traMADol (ULTRAM) 50 MG tablet Take 1 tablet (50 mg total) by mouth every 8 (eight) hours as needed for pain.  30 tablet  0   No current facility-administered medications on file prior to visit.   History reviewed. No pertinent family history. History   Social History  . Marital Status: Divorced    Spouse Name: N/A    Number of Children: N/A  . Years of Education: N/A   Occupational History  . Not on file.   Social History Main Topics  . Smoking status: Never Smoker   . Smokeless tobacco: Not on file  . Alcohol Use: No  . Drug Use: No  . Sexually Active: Not on file   Other Topics Concern  . Not on file   Social History Narrative  . No narrative on file    Review of  Systems  Constitutional: Negative for fever, chills, diaphoresis, activity change, appetite change and fatigue.  HENT: Negative for ear pain, nosebleeds, congestion, facial swelling, rhinorrhea, neck pain, neck stiffness and ear discharge.   Eyes: Negative for pain, discharge, redness, itching and visual disturbance.  Respiratory: Negative for cough, choking, chest tightness, shortness of breath, wheezing and stridor.   Cardiovascular: Negative for chest pain, palpitations and leg swelling.  Gastrointestinal: Negative for abdominal distention. chronic constipation Genitourinary: Negative for dysuria, urgency, frequency, hematuria, flank pain, decreased urine volume, difficulty urinating and dyspareunia.  Musculoskeletal: Negative for back pain, joint swelling, arthralgias and gait problem.  Neurological: Negative for dizziness, tremors, seizures, syncope, facial asymmetry, speech difficulty, weakness, light-headedness, numbness and headaches.  Hematological: Negative for adenopathy. Does not bruise/bleed easily.  Psychiatric/Behavioral: Negative for hallucinations, behavioral problems, confusion, dysphoric mood, decreased concentration and agitation.    Objective:   Filed Vitals:   05/09/13 1600  BP: 127/84  Pulse: 80  Temp: 98.2 F (36.8 C)  Resp: 18    Physical Exam  Constitutional: Appears well-developed and well-nourished. No distress.  HENT: Normocephalic. External right and left ear normal. Oropharynx is clear and moist.  Eyes: Conjunctivae and EOM  are normal. PERRLA, no scleral icterus.  Neck: Normal ROM. Neck supple. No JVD. No tracheal deviation. No thyromegaly.  CVS: RRR, S1/S2 +, no murmurs, no gallops, no carotid bruit.  Pulmonary: Effort and breath sounds normal, no stridor, rhonchi, wheezes, rales.  Abdominal: Soft. BS +,  no distension, tenderness, rebound or guarding.  Musculoskeletal: Normal range of motion. No edema and no tenderness.  Lymphadenopathy: No  lymphadenopathy noted, cervical, inguinal. Neuro: Alert. Normal reflexes, muscle tone coordination. No cranial nerve deficit. Skin: Skin is warm and dry. No rash noted. Not diaphoretic. No erythema. No pallor.  Psychiatric: Normal mood and affect. Behavior, judgment, thought content normal.   Lab Results  Component Value Date   WBC 3.7* 04/23/2013   HGB 13.5 04/23/2013   HCT 39.6 04/23/2013   MCV 87.4 04/23/2013   PLT 210 04/23/2013   Lab Results  Component Value Date   CREATININE 0.98 04/23/2013   BUN 7 04/23/2013   NA 142 04/23/2013   K 3.9 04/23/2013   CL 103 04/23/2013   CO2 30 04/23/2013    No results found for this basename: HGBA1C   Lipid Panel  No results found for this basename: chol, trig, hdl, cholhdl, vldl, ldlcalc       Assessment and plan:   Patient Active Problem List   Diagnosis Date Noted  . Gastritis 05/09/2013  . Unspecified constipation 05/09/2013  . Knee pain 05/09/2013  . ELBOW PAIN, RIGHT 12/10/2009  . HYPERTENSION 10/21/2009  . SEIZURE DISORDER 10/21/2009   Gastritis Unspecified constipation Diverticulosis  Knee pain, unspecified laterality  I reviewed the EGD with the patient today.  I also gave her prescription for Nexium 40 mg to continue taking daily until she follows up with her gastroenterologist.  I also recommended high fiber diet for treatment of her constipation.  Follow up in 3 months for hypertension   The patient was given clear instructions to go to ER or return to medical center if symptoms don't improve, worsen or new problems develop.  The patient verbalized understanding.  The patient was told to call to get any lab results if not heard anything in the next week.    Rodney Langton, MD, CDE, FAAFP [facility] [place], [place]

## 2013-05-09 NOTE — Progress Notes (Signed)
05/09/13 Present for follow up from upper endoscopy [facility]

## 2013-06-07 ENCOUNTER — Other Ambulatory Visit: Payer: Self-pay | Admitting: Family Medicine

## 2013-06-07 MED ORDER — DIVALPROEX SODIUM 500 MG PO DR TAB: 1000.0000 mg | DELAYED_RELEASE_TABLET | Freq: Every morning | ORAL | Status: DC

## 2013-06-07 MED ORDER — CARBAMAZEPINE ER 400 MG PO TB12: 800.0000 mg | ORAL_TABLET | Freq: Every day | ORAL | Status: DC

## 2013-06-13 ENCOUNTER — Ambulatory Visit: Payer: Self-pay

## 2013-06-13 ENCOUNTER — Telehealth: Payer: Self-pay | Admitting: Family Medicine

## 2013-06-13 ENCOUNTER — Ambulatory Visit: Payer: No Typology Code available for payment source | Attending: Family Medicine | Admitting: Family Medicine

## 2013-06-13 VITALS — BP 121/82 | HR 72 | Temp 98.4°F | Resp 16 | Ht 63.0 in | Wt 193.0 lb

## 2013-06-13 DIAGNOSIS — I10 Essential (primary) hypertension: Secondary | ICD-10-CM | POA: Insufficient documentation

## 2013-06-13 DIAGNOSIS — G40909 Epilepsy, unspecified, not intractable, without status epilepticus: Secondary | ICD-10-CM | POA: Insufficient documentation

## 2013-06-13 MED ORDER — OLMESARTAN MEDOXOMIL-HCTZ 20-12.5 MG PO TABS: 1.0000 | ORAL_TABLET | Freq: Every day | ORAL | Status: DC

## 2013-06-13 MED ORDER — DIVALPROEX SODIUM 500 MG PO DR TAB: 1000.0000 mg | DELAYED_RELEASE_TABLET | Freq: Every morning | ORAL | Status: DC

## 2013-06-13 MED ORDER — CARBAMAZEPINE 200 MG PO TABS: 400.0000 mg | ORAL_TABLET | Freq: Two times a day (BID) | ORAL | Status: DC

## 2013-06-13 MED ORDER — CARBAMAZEPINE ER 400 MG PO TB12: 800.0000 mg | ORAL_TABLET | Freq: Every day | ORAL | Status: DC

## 2013-06-13 NOTE — Progress Notes (Signed)
Patient presents for refills for tegretol xr; states she is going through [facility] for this medication, but that currently [facility] only have tegretol 200 mg non xr; wants to know if she can get her medication changed to reflect this so she can get this medication;  She states she takes 400 mg of the XR per day.

## 2013-06-13 NOTE — Progress Notes (Signed)
Patient ID: Amanda Roach, female   DOB: 03-30-1956, 57 y.o.   MRN: [id number]  CC:  Chief Complaint  Patient presents with  . Follow-up  . Medication Management   HPI: Pt is having a difficult time getting her seizure medications.  She reports that for 4 weeks she will not be able to get her Tegretol XR.  She reports that she can afford to get the tegretol 200 mg caps that are generic to take in the interim until her XR med arrives from the company.  She is asking for a new prescription to take to the health department.  She denies seizure activity and has been stable.  No Known Allergies Past Medical History  Diagnosis Date  . Seizure   . Hypertension   . Knee pain   . Asthma    Current Outpatient Prescriptions on File Prior to Visit  Medication Sig Dispense Refill  . divalproex (DEPAKOTE) 500 MG DR tablet Take 2 tablets (1,000 mg total) by mouth every morning.  60 tablet  3  . esomeprazole (NEXIUM) 40 MG capsule Take 1 capsule (40 mg total) by mouth daily before breakfast.  30 capsule  3  . ibuprofen (ADVIL,MOTRIN) 800 MG tablet Take 800 mg by mouth 3 (three) times daily as needed for pain. For knee pain      . olmesartan-hydrochlorothiazide (BENICAR HCT) 20-12.5 MG per tablet Take 1 tablet by mouth daily.  30 tablet  3  . traMADol (ULTRAM) 50 MG tablet Take 1 tablet (50 mg total) by mouth every 8 (eight) hours as needed for pain.  30 tablet  0  . ondansetron (ZOFRAN) 4 MG tablet Take 1 tablet (4 mg total) by mouth every 4 (four) hours as needed for nausea.  65 tablet  3   No current facility-administered medications on file prior to visit.   History reviewed. No pertinent family history. History   Social History  . Marital Status: Divorced    Spouse Name: N/A    Number of Children: N/A  . Years of Education: N/A   Occupational History  . Not on file.   Social History Main Topics  . Smoking status: Never Smoker   . Smokeless tobacco: Not on file  . Alcohol Use: No  .  Drug Use: No  . Sexually Active: Not on file   Other Topics Concern  . Not on file   Social History Narrative  . No narrative on file    Review of Systems  Constitutional: Negative for fever, chills, diaphoresis, activity change, appetite change and fatigue.  HENT: Negative for ear pain, nosebleeds, congestion, facial swelling, rhinorrhea, neck pain, neck stiffness and ear discharge.   Eyes: Negative for pain, discharge, redness, itching and visual disturbance.  Respiratory: Negative for cough, choking, chest tightness, shortness of breath, wheezing and stridor.   Cardiovascular: Negative for chest pain, palpitations and leg swelling.  Gastrointestinal: Negative for abdominal distention.  Genitourinary: Negative for dysuria, urgency, frequency, hematuria, flank pain, decreased urine volume, difficulty urinating and dyspareunia.  Musculoskeletal: Negative for back pain, joint swelling, arthralgias and gait problem.  Neurological: Negative for dizziness, tremors, seizures, syncope, facial asymmetry, speech difficulty, weakness, light-headedness, numbness and headaches.  Hematological: Negative for adenopathy. Does not bruise/bleed easily.  Psychiatric/Behavioral: Negative for hallucinations, behavioral problems, confusion, dysphoric mood, decreased concentration and agitation.    Objective:   Filed Vitals:   06/13/13 1636  BP: 121/82  Pulse: 72  Temp: 98.4 F (36.9 C)  Resp: 16  Physical Exam  Constitutional: Appears well-developed and well-nourished. No distress.  HENT: Normocephalic. External right and left ear normal. Oropharynx is clear and moist.  Eyes: Conjunctivae and EOM are normal. PERRLA, no scleral icterus.  Neck: Normal ROM. Neck supple. No JVD. No tracheal deviation. No thyromegaly.  CVS: RRR, S1/S2 +, no murmurs, no gallops, no carotid bruit.  Pulmonary: Effort and breath sounds normal, no stridor, rhonchi, wheezes, rales.  Abdominal: Soft. BS +,  no  distension, tenderness, rebound or guarding.  Musculoskeletal: Normal range of motion. No edema and no tenderness.  Lymphadenopathy: No lymphadenopathy noted, cervical, inguinal. Neuro: Alert. Normal reflexes, muscle tone coordination. No cranial nerve deficit. Skin: Skin is warm and dry. No rash noted. Not diaphoretic. No erythema. No pallor.  Psychiatric: Normal mood and affect. Behavior, judgment, thought content normal.   Lab Results  Component Value Date   WBC 3.7* 04/23/2013   HGB 13.5 04/23/2013   HCT 39.6 04/23/2013   MCV 87.4 04/23/2013   PLT 210 04/23/2013   Lab Results  Component Value Date   CREATININE 0.98 04/23/2013   BUN 7 04/23/2013   NA 142 04/23/2013   K 3.9 04/23/2013   CL 103 04/23/2013   CO2 30 04/23/2013    No results found for this basename: HGBA1C   Lipid Panel  No results found for this basename: chol, trig, hdl, cholhdl, vldl, ldlcalc       Assessment and plan:   Patient Active Problem List   Diagnosis Date Noted  . Gastritis 05/09/2013  . Unspecified constipation 05/09/2013  . Knee pain 05/09/2013  . Diverticulosis 05/09/2013  . ELBOW PAIN, RIGHT 12/10/2009  . HYPERTENSION 10/21/2009  . SEIZURE DISORDER 10/21/2009   Epilepsy  Unspecified essential hypertension  Switched to tegretol 200 mg tabs take 2 po BID for 1 month until pt can get her XR formulation from the health department  RTC in September as scheduled  The patient was given clear instructions to go to ER or return to medical center if symptoms don't improve, worsen or new problems develop.  The patient verbalized understanding.  The patient was told to call to get any lab results if not heard anything in the next week.    Rodney Langton, MD, CDE, FAAFP [facility] [place], [place]

## 2013-06-13 NOTE — Telephone Encounter (Signed)
Please inform patient that prescription was faxed to health department pharmacy.  Rodney Langton, MD, CDE, FAAFP [facility] [place], [place]

## 2013-06-13 NOTE — Telephone Encounter (Signed)
Pt has come in needing a medication refill;pt is out of medications and feeling dizzy; pt needs tegratol (400mg ); due to our workflow pt needs to reschedule;

## 2013-06-13 NOTE — Patient Instructions (Signed)
Epilepsy A seizure (convulsion) is a sudden change in brain function that causes a change in behavior, muscle activity, or ability to remain awake and alert. If a person has recurring seizures, this is called epilepsy. CAUSES  Epilepsy is a disorder with many possible causes. Anything that disturbs the normal pattern of brain cell activity can lead to seizures. Seizure can be caused from illness to brain damage to abnormal brain development. Epilepsy may develop because of:  An abnormality in brain wiring.  An imbalance of nerve signaling chemicals (neurotransmitters).  Some combination of these factors. Scientists are learning an increasing amount about genetic causes of seizures. SYMPTOMS  The symptoms of a seizure can vary greatly from one person to another. These may include:  An aura, or warning that tells a person they are about to have a seizure.  Abnormal sensations, such as abnormal smell or seeing flashing lights.  Sudden, general body stiffness.  Rhythmic jerking of the face, arm, or leg  on one or both sides.  Sudden change in consciousness.  The person may appear to be awake but not responding.  They may appear to be asleep but cannot be awakened.  Grimacing, chewing, lip smacking, or drooling.  Often there is a period of sleepiness after a seizure. DIAGNOSIS  The description you give to your caregiver about what you experienced will help them understand your problems. Equally important is the description by any witnesses to your seizure. A physical exam, including a detailed neurological exam, is necessary. An EEG (electroencephalogram) is a painless test of your brain waves. In this test a diagram is created of your brain waves. These diagrams can be interpreted by a specialist. Pictures of your brain are usually taken with:  An MRI.  A CT scan. Lab tests may be done to look for:  Signs of infection.  Abnormal blood chemistry. PREVENTION  There is no way to  prevent the development of epilepsy. If you have seizures that are typically triggered by an event (such as flashing lights), try to avoid the trigger. This can help you avoid a seizure.  PROGNOSIS  Most people with epilepsy lead outwardly normal lives. While epilepsy cannot currently be cured, for some people it does eventually go away. Most seizures do not cause brain damage. It is not uncommon for people with epilepsy, especially children, to develop behavioral and emotional problems. These problems are sometimes the consequence of medicine for seizures or social stress. For some people with epilepsy, the risk of seizures restricts their independence and recreational activities. For example, some states refuse drivers licenses to people with epilepsy. Most women with epilepsy can become pregnant. They should discuss their epilepsy and the medicine they are taking with their caregivers. Women with epilepsy have a 90 percent or better chance of having a normal, healthy baby. RISKS AND COMPLICATIONS  People with epilepsy are at increased risk of falls, accidents, and injuries. People with epilepsy are at special risk for two life-threatening conditions. These are status epilepticus and sudden unexplained death (extremely rare). Status epilepticus is a long lasting, continuous seizure that is a medical emergency. TREATMENT  Once epilepsy is diagnosed, it is important to begin treatment as soon as possible. For about 80 percent of those diagnosed with epilepsy, seizures can be controlled with modern medicines and surgical techniques. Some antiepileptic drugs can interfere with the effectiveness of oral contraceptives. In 1997, the FDA approved a pacemaker for the brain the (vagus nerve stimulator). This stimulator can be used for   and surgical techniques. Some antiepileptic drugs can interfere with the effectiveness of oral contraceptives. In 1997, the FDA approved a pacemaker for the brain the (vagus nerve stimulator). This stimulator can be used for people with seizures that are not well-controlled by medicine. Studies have shown that in some cases, children may experience fewer seizures if they maintain a strict diet. The strict  diet is called the ketogenic diet. This diet is rich in fats and low in carbohydrates.  HOME CARE INSTRUCTIONS    Your caregiver will make recommendations about driving and safety in normal activities. Follow these carefully.   Take any medicine prescribed exactly as directed.   Do any blood tests requested to monitor the levels of your medicine.   The people you live and work with should know that you are prone to seizures. They should receive instructions on how to help you. In general, a witness to a seizure should:   Cushion your head and body.   Turn you on your side.   Avoid unnecessarily restraining you.   Not place anything inside your mouth.   Call for local emergency medical help if there is any question about what has occurred.   Keep a seizure diary. Record what you recall about any seizure, especially any possible trigger.   If your caregiver has given you a follow-up appointment, it is very important to keep that appointment. Not keeping the appointment could result in permanent injury and disability. If there is any problem keeping the appointment, you must call back to this facility for assistance.  SEEK MEDICAL CARE IF:    You develop signs of infection or other illness. This might increase the risk of a seizure.   You seem to be having more frequent seizures.   Your seizure pattern is changing.  SEEK IMMEDIATE MEDICAL CARE IF:    A seizure does not stop after a few moments.   A seizure causes any difficulty in breathing.   A seizure results in a very severe headache.   A seizure leaves you with the inability to speak or use a part of your body.  MAKE SURE YOU:    Understand these instructions.   Will watch your condition.   Will get help right away if you are not doing well or get worse.  Document Released: 10/25/2005 Document Revised: 01/17/2012 Document Reviewed: 05/31/2008  [employer] Patient Information 2014 [employer].

## 2013-07-10 ENCOUNTER — Ambulatory Visit: Payer: Self-pay

## 2013-07-19 ENCOUNTER — Ambulatory Visit: Payer: No Typology Code available for payment source | Attending: Internal Medicine | Admitting: Internal Medicine

## 2013-07-19 ENCOUNTER — Encounter: Payer: Self-pay | Admitting: Internal Medicine

## 2013-07-19 VITALS — BP 133/81 | HR 79 | Temp 98.2°F | Resp 16 | Ht 62.6 in | Wt 191.0 lb

## 2013-07-19 DIAGNOSIS — F3289 Other specified depressive episodes: Secondary | ICD-10-CM

## 2013-07-19 DIAGNOSIS — G40909 Epilepsy, unspecified, not intractable, without status epilepticus: Secondary | ICD-10-CM | POA: Insufficient documentation

## 2013-07-19 DIAGNOSIS — F32A Depression, unspecified: Secondary | ICD-10-CM | POA: Insufficient documentation

## 2013-07-19 DIAGNOSIS — M1711 Unilateral primary osteoarthritis, right knee: Secondary | ICD-10-CM | POA: Insufficient documentation

## 2013-07-19 DIAGNOSIS — M25569 Pain in unspecified knee: Secondary | ICD-10-CM | POA: Insufficient documentation

## 2013-07-19 DIAGNOSIS — I10 Essential (primary) hypertension: Secondary | ICD-10-CM

## 2013-07-19 DIAGNOSIS — M1710 Unilateral primary osteoarthritis, unspecified knee: Secondary | ICD-10-CM

## 2013-07-19 DIAGNOSIS — F329 Major depressive disorder, single episode, unspecified: Secondary | ICD-10-CM

## 2013-07-19 DIAGNOSIS — IMO0002 Reserved for concepts with insufficient information to code with codable children: Secondary | ICD-10-CM

## 2013-07-19 MED ORDER — IBUPROFEN 800 MG PO TABS: 800.0000 mg | ORAL_TABLET | Freq: Four times a day (QID) | ORAL | Status: DC | PRN

## 2013-07-19 MED ORDER — ESOMEPRAZOLE MAGNESIUM 40 MG PO CPDR: 40.0000 mg | DELAYED_RELEASE_CAPSULE | Freq: Every day | ORAL | Status: DC

## 2013-07-19 NOTE — Progress Notes (Signed)
Pt is here for a F/U vist. Pt has a history of seizures but has been doing great on her new medications. Pt reports that in Nov 24, 2012 she fell at work and hurt her right knee. Since the accident she has constant pain that is stabbing and it locks up sometimes. Pain scale is an 8

## 2013-07-19 NOTE — Progress Notes (Signed)
Patient ID: Amanda Roach, female   DOB: Apr 17, 1956, 57 y.o.   MRN: [id number] Patient Demographics  Amanda Roach, is a 57 y.o. female  WGN:[id number]  HQI:[id number]  DOB - January 04, 1956  Chief Complaint  Patient presents with  . Follow-up        Subjective:   Amanda Roach today is here for a follow up visit. She reports she is doing well but complains that she is tired of her present situation, she claims no one no one wants to listen to her or care for her. She said many people are stealing from her and taken advantage, she was tearful during this encounter. She also reports the right knee pain that started since she fell in 11/24/2012. She says she had seen an orthopedic surgeon, but she could not continue because of financial difficulties. She said her knee hurts so bad sometimes it pops. No new swelling no weakness in the right leg and knee joint. Patient has not had colonoscopy, mammogram or Pap smear done. She denies any suicidal ideation or thoughts Patient has No headache, No chest pain, No abdominal pain - No Nausea, No new weakness tingling or numbness, No Cough - SOB.   ALLERGIES:  No Known Allergies  PAST MEDICAL HISTORY: Past Medical History  Diagnosis Date  . Seizure   . Hypertension   . Knee pain   . Asthma     MEDICATIONS AT HOME: Prior to Admission medications   Medication Sig Start Date End Date Taking? Authorizing Provider  carbamazepine (TEGRETOL) 200 MG tablet Take 2 tablets (400 mg total) by mouth 2 (two) times daily. 06/13/13  Yes Clanford Cyndie Mull, MD  divalproex (DEPAKOTE) 500 MG DR tablet Take 2 tablets (1,000 mg total) by mouth every morning. 06/13/13  Yes Clanford Cyndie Mull, MD  esomeprazole (NEXIUM) 40 MG capsule Take 1 capsule (40 mg total) by mouth daily before breakfast. 07/19/13  Yes Jeanann Lewandowsky, MD  ibuprofen (ADVIL,MOTRIN) 800 MG tablet Take 1 tablet (800 mg total) by mouth every 6 (six) hours as needed for pain. For knee pain 07/19/13  Yes  Jeanann Lewandowsky, MD  olmesartan-hydrochlorothiazide (BENICAR HCT) 20-12.5 MG per tablet Take 1 tablet by mouth daily. 06/13/13  Yes Clanford Cyndie Mull, MD  ondansetron (ZOFRAN) 4 MG tablet Take 1 tablet (4 mg total) by mouth every 4 (four) hours as needed for nausea. 04/12/13  Yes Dorothea Ogle, MD  traMADol (ULTRAM) 50 MG tablet Take 1 tablet (50 mg total) by mouth every 8 (eight) hours as needed for pain. 04/12/13  Yes Dorothea Ogle, MD     Objective:   Filed Vitals:   07/19/13 1236  BP: 133/81  Pulse: 79  Temp: 98.2 F (36.8 C)  TempSrc: Oral  Resp: 16  Height: 5' 2.6" (1.59 m)  Weight: 191 lb (86.637 kg)  SpO2: 98%    Exam General appearance :Awake, alert, not in any distress. Speech Clear. Not toxic Looking, tearful HEENT: Atraumatic and Normocephalic, pupils equally reactive to light and accomodation Neck: supple, no JVD. No cervical lymphadenopathy.  Chest:Good air entry bilaterally, no added sounds  CVS: S1 S2 regular, no murmurs.  Abdomen: Bowel sounds present, Non tender and not distended with no gaurding, rigidity or rebound. Extremities: B/L Lower Ext shows no edema, both legs are warm to touch Neurology: Awake alert, and oriented X 3, CN II-XII intact, Non focal Skin:No Rash Wounds:N/A   Data Review   CBC No results found for this basename: WBC, HGB, HCT,  PLT, MCV, MCH, MCHC, RDW, NEUTRABS, LYMPHSABS, MONOABS, EOSABS, BASOSABS, BANDABS, BANDSABD,  in the last 168 hours  Chemistries   No results found for this basename: NA, K, CL, CO2, GLUCOSE, BUN, CREATININE, GFRCGP, CALCIUM, MG, AST, ALT, ALKPHOS, BILITOT,  in the last 168 hours ------------------------------------------------------------------------------------------------------------------ No results found for this basename: HGBA1C,  in the last 72 hours ------------------------------------------------------------------------------------------------------------------ No results found for this basename: CHOL,  HDL, LDLCALC, TRIG, CHOLHDL, LDLDIRECT,  in the last 72 hours ------------------------------------------------------------------------------------------------------------------ No results found for this basename: TSH, T4TOTAL, FREET3, T3FREE, THYROIDAB,  in the last 72 hours ------------------------------------------------------------------------------------------------------------------ No results found for this basename: VITAMINB12, FOLATE, FERRITIN, TIBC, IRON, RETICCTPCT,  in the last 72 hours  Coagulation profile  No results found for this basename: INR, PROTIME,  in the last 168 hours    Assessment & Plan   Patient Active Problem List   Diagnosis Date Noted  . Gastritis 05/09/2013  . Unspecified constipation 05/09/2013  . Knee pain 05/09/2013  . Diverticulosis 05/09/2013  . ELBOW PAIN, RIGHT 12/10/2009  . HYPERTENSION 10/21/2009  . SEIZURE DISORDER 10/21/2009   Plan: Ibuprofen 800 mg tablet by mouth Q8 hours when necessary pain Nexium 40 mg capsule by mouth daily  Continue other medications for seizure and hypertension Patient has been counseled extensively about diagnosis Will obtain an x-ray of the right knee Discontinuity tramadol because it lowers seizure threshold Patient was referred to a Child psychotherapist here for social support and counseling Ambulatory referral to psychiatrist for major depression  Patient to schedule appointment in 2 months for Health Maintenance** -Colonoscopy:  -Pap Smear: -Mammogram: -Vaccinations:  -TdAP  -PNA (PPSV23) (one dose after 65) (or one dose before 65 if chronic conditions)  -Zoster (1 dose after 60 yrs)  -Influenza  Follow up in 2 months   The patient was given clear instructions to go to ER or return to medical center if symptoms don't improve, worsen or new problems develop. The patient verbalized understanding. The patient was told to call to get lab results if they haven't heard anything in the next week.   Jeanann Lewandowsky, MD, MHA, FACP [facility] [place], [place] [phone]   07/19/2013, 12:56 PM

## 2013-07-20 ENCOUNTER — Telehealth: Payer: Self-pay

## 2013-07-20 NOTE — Telephone Encounter (Signed)
Pharmacy called to see if we can switch patient from tegretol To tegretol XR    -400mg  qhs Per Dr Hyman Hopes ok to switch- map dept can get this medication for free

## 2013-07-24 ENCOUNTER — Ambulatory Visit: Payer: No Typology Code available for payment source | Attending: Family Medicine

## 2013-07-24 NOTE — Progress Notes (Unsigned)
CSW met with patient in order to assess clinical needs in order to identify appropriate referral.  Patient states that she has been having challenges with anxiety and depressed.  CSW completed assessments for Anxiety, Panic Disorder and Depression.  Patient scored on all three assessments significantly enough to identify further assessment and treatment needed for Generalized Anxiety Disorder, Panic Disorder and Major Depressive Disorder.  CSW recommended that patient seek further assessment and treatment with local community agency that can manage both psychotherapeutic and psychopharmacological needs.   CSW will follow as needed.  Beverly Sessions MSW, LCSW [phone] Duration 40 min

## 2013-09-18 ENCOUNTER — Ambulatory Visit: Payer: No Typology Code available for payment source | Attending: Internal Medicine | Admitting: Internal Medicine

## 2013-09-18 VITALS — BP 151/85 | HR 76 | Temp 98.3°F | Resp 18

## 2013-09-18 DIAGNOSIS — R413 Other amnesia: Secondary | ICD-10-CM | POA: Insufficient documentation

## 2013-09-18 DIAGNOSIS — R569 Unspecified convulsions: Secondary | ICD-10-CM

## 2013-09-18 DIAGNOSIS — I10 Essential (primary) hypertension: Secondary | ICD-10-CM | POA: Insufficient documentation

## 2013-09-18 MED ORDER — CARBAMAZEPINE 200 MG PO TABS: 400.0000 mg | ORAL_TABLET | Freq: Two times a day (BID) | ORAL | Status: DC

## 2013-09-18 MED ORDER — OLMESARTAN MEDOXOMIL-HCTZ 20-12.5 MG PO TABS: 1.0000 | ORAL_TABLET | Freq: Every day | ORAL | Status: DC

## 2013-09-18 NOTE — Progress Notes (Signed)
Patient ID: Amanda Roach, female   DOB: Feb 18, 1956, 57 y.o.   MRN: [id number]  CC: Followup  HPI: 57 year old female with past medical history hypertension, arthritis, seizure disorder who presented to clinic for followup. Patient reported that she feels that her memory has significantly declined over past 1 year. She forgets where she parked, which left her car keys, even the memories from the morning until the evening. No loss of consciousness. No falls.  No Known Allergies Past Medical History  Diagnosis Date  . Seizure   . Hypertension   . Knee pain   . Asthma    Current Outpatient Prescriptions on File Prior to Visit  Medication Sig Dispense Refill  . divalproex (DEPAKOTE) 500 MG DR tablet Take 2 tablets (1,000 mg total) by mouth every morning.  60 tablet  3  . esomeprazole (NEXIUM) 40 MG capsule Take 1 capsule (40 mg total) by mouth daily before breakfast.  30 capsule  3  . ibuprofen (ADVIL,MOTRIN) 800 MG tablet Take 1 tablet (800 mg total) by mouth every 6 (six) hours as needed for pain. For knee pain  60 tablet  1  . ondansetron (ZOFRAN) 4 MG tablet Take 1 tablet (4 mg total) by mouth every 4 (four) hours as needed for nausea.  65 tablet  3  . traMADol (ULTRAM) 50 MG tablet Take 1 tablet (50 mg total) by mouth every 8 (eight) hours as needed for pain.  30 tablet  0   No current facility-administered medications on file prior to visit.   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  History   Social History  . Marital Status: Divorced    Spouse Name: N/A    Number of Children: N/A  . Years of Education: N/A   Occupational History  . Not on file.   Social History Main Topics  . Smoking status: Never Smoker   . Smokeless tobacco: Not on file  . Alcohol Use: No  . Drug Use: No  . Sexual Activity: Not on file   Other Topics Concern  . Not on file   Social History Narrative  . No narrative on file    Review of Systems  Constitutional: Negative for fever, chills, diaphoresis,  activity change, appetite change and fatigue.  HENT: Negative for ear pain, nosebleeds, congestion, facial swelling, rhinorrhea, neck pain, neck stiffness and ear discharge.   Eyes: Negative for pain, discharge, redness, itching and visual disturbance.  Respiratory: Negative for cough, choking, chest tightness, shortness of breath, wheezing and stridor.   Cardiovascular: Negative for chest pain, palpitations and leg swelling.  Gastrointestinal: Negative for abdominal distention.  Genitourinary: Negative for dysuria, urgency, frequency, hematuria, flank pain, decreased urine volume, difficulty urinating and dyspareunia.  Musculoskeletal: Negative for back pain, joint swelling, arthralgias and gait problem.  Neurological: Negative for dizziness, tremors, seizures, syncope, facial asymmetry, speech difficulty, weakness, light-headedness, numbness and headaches.  positive for memory loss Hematological: Negative for adenopathy. Does not bruise/bleed easily.  Psychiatric/Behavioral: Negative for hallucinations, behavioral problems, confusion, dysphoric mood, decreased concentration and agitation.    Objective:   Filed Vitals:   09/18/13 1106  BP: 151/85  Pulse: 76  Temp: 98.3 F (36.8 C)  Resp: 18    Physical Exam  Constitutional: Appears well-developed and well-nourished. No distress.  HENT: Normocephalic. External right and left ear normal. Oropharynx is clear and moist.  Eyes: Conjunctivae and EOM are normal. PERRLA, no scleral icterus.  Neck: Normal ROM. Neck supple. No JVD. No tracheal deviation. No thyromegaly.  CVS: RRR,  S1/S2 +, no murmurs, no gallops, no carotid bruit.  Pulmonary: Effort and breath sounds normal, no stridor, rhonchi, wheezes, rales.  Abdominal: Soft. BS +,  no distension, tenderness, rebound or guarding.  Musculoskeletal: Normal range of motion. No edema and no tenderness.  Lymphadenopathy: No lymphadenopathy noted, cervical, inguinal. Neuro: Alert. Normal  reflexes, muscle tone coordination. No cranial nerve deficit. Skin: Skin is warm and dry. No rash noted. Not diaphoretic. No erythema. No pallor.  Psychiatric: Normal mood and affect. Behavior, judgment, thought content normal.   Lab Results  Component Value Date   WBC 3.7* 04/23/2013   HGB 13.5 04/23/2013   HCT 39.6 04/23/2013   MCV 87.4 04/23/2013   PLT 210 04/23/2013   Lab Results  Component Value Date   CREATININE 0.98 04/23/2013   BUN 7 04/23/2013   NA 142 04/23/2013   K 3.9 04/23/2013   CL 103 04/23/2013   CO2 30 04/23/2013    No results found for this basename: HGBA1C   Lipid Panel  No results found for this basename: chol, trig, hdl, cholhdl, vldl, ldlcalc       Assessment and plan:   Patient Active Problem List   Diagnosis Date Noted  . Memory loss 09/18/2013    Priority: Medium - referral to neurology provider   . HTN (hypertension) 07/19/2013    Priority: Medium - Continue current BP meds. Patient did not take morning medications  - We have discussed target BP range - I have advised pt to check BP regularly and to call us back if the numbers are higher than 140/90 - discussed the importance of compliance with medical therapy and diet   . SEIZURE DISORDER 10/21/2009    Priority: Medium - continue depakote

## 2013-09-18 NOTE — Progress Notes (Signed)
Patient here for follow up Right knee and right shoulder pain Has been having some memory loss Would like a referral to neuro

## 2013-09-18 NOTE — Patient Instructions (Signed)

## 2013-09-28 ENCOUNTER — Ambulatory Visit: Payer: Self-pay | Attending: Internal Medicine

## 2013-10-01 ENCOUNTER — Encounter: Payer: Self-pay | Admitting: Neurology

## 2013-10-09 ENCOUNTER — Institutional Professional Consult (permissible substitution): Payer: Self-pay | Admitting: Neurology

## 2013-12-31 ENCOUNTER — Ambulatory Visit: Payer: No Typology Code available for payment source | Attending: Internal Medicine | Admitting: Internal Medicine

## 2013-12-31 ENCOUNTER — Encounter: Payer: Self-pay | Admitting: Internal Medicine

## 2013-12-31 ENCOUNTER — Ambulatory Visit (HOSPITAL_COMMUNITY)
Admission: RE | Admit: 2013-12-31 | Discharge: 2013-12-31 | Disposition: A | Discharge status: Home / Self Care | Payer: No Typology Code available for payment source | Source: Ambulatory Visit | Attending: Internal Medicine | Admitting: Internal Medicine

## 2013-12-31 VITALS — BP 103/72 | HR 83 | Temp 98.4°F | Resp 16 | Ht 62.0 in | Wt 212.0 lb

## 2013-12-31 DIAGNOSIS — I10 Essential (primary) hypertension: Secondary | ICD-10-CM | POA: Insufficient documentation

## 2013-12-31 DIAGNOSIS — M79609 Pain in unspecified limb: Secondary | ICD-10-CM | POA: Insufficient documentation

## 2013-12-31 DIAGNOSIS — X58XXXA Exposure to other specified factors, initial encounter: Secondary | ICD-10-CM | POA: Insufficient documentation

## 2013-12-31 DIAGNOSIS — M79674 Pain in right toe(s): Secondary | ICD-10-CM

## 2013-12-31 DIAGNOSIS — S92919A Unspecified fracture of unspecified toe(s), initial encounter for closed fracture: Secondary | ICD-10-CM | POA: Insufficient documentation

## 2013-12-31 DIAGNOSIS — R569 Unspecified convulsions: Secondary | ICD-10-CM

## 2013-12-31 DIAGNOSIS — G40909 Epilepsy, unspecified, not intractable, without status epilepticus: Secondary | ICD-10-CM | POA: Insufficient documentation

## 2013-12-31 DIAGNOSIS — Z09 Encounter for follow-up examination after completed treatment for conditions other than malignant neoplasm: Secondary | ICD-10-CM | POA: Insufficient documentation

## 2013-12-31 DIAGNOSIS — J45909 Unspecified asthma, uncomplicated: Secondary | ICD-10-CM | POA: Insufficient documentation

## 2013-12-31 DIAGNOSIS — M25569 Pain in unspecified knee: Secondary | ICD-10-CM | POA: Insufficient documentation

## 2013-12-31 MED ORDER — NAPROXEN 500 MG PO TABS: 500.0000 mg | ORAL_TABLET | Freq: Two times a day (BID) | ORAL | Status: DC

## 2013-12-31 MED ORDER — ACETAMINOPHEN-CODEINE #3 300-30 MG PO TABS: 1.0000 | ORAL_TABLET | ORAL | Status: DC | PRN

## 2013-12-31 NOTE — Progress Notes (Signed)
Pt is here following up on her right knee and right foot pain.

## 2013-12-31 NOTE — Progress Notes (Signed)
Patient ID: Amanda Roach, female   DOB: [birth year], 58 y.o.   MRN: [id number]   Amanda Roach, is a 58 y.o. female  OQH:[id number]  TWS:[id number]  DOB - August 07, 1956  Chief Complaint  Patient presents with  . Follow-up        Subjective:   Amanda Roach is a 58 y.o. female here today for a follow up visit. Patient has history of hypertension asthma and seizure disorder, came in today to complain of pain in her right big toe. About 3 weeks ago patient fell and injured her right big toe, she has since used over-the-counter ibuprofen for pain, there was initial swelling which has reduced but still noticeable, pain has persisted despite time lapse and pain medication, patient could barely with her shows because of pain. Her seizure disorder is stable, blood pressure is controlled. She has no other complaints, she does not smoke cigarette she does not drink alcohol. Patient has No headache, No chest pain, No abdominal pain - No Nausea, No new weakness tingling or numbness, No Cough - SOB.  Problem  Pain of Right Great Toe    ALLERGIES: No Known Allergies  PAST MEDICAL HISTORY: Past Medical History  Diagnosis Date  . Seizure   . Hypertension   . Knee pain   . Asthma     MEDICATIONS AT HOME: Prior to Admission medications   Medication Sig Start Date End Date Taking? Authorizing Provider  acetaminophen-codeine (TYLENOL #3) 300-30 MG per tablet Take 1 tablet by mouth every 4 (four) hours as needed. 12/31/13   Angelica Chessman, MD  carbamazepine (TEGRETOL) 200 MG tablet Take 2 tablets (400 mg total) by mouth 2 (two) times daily. 09/18/13   Robbie Lis, MD  divalproex (DEPAKOTE) 500 MG DR tablet Take 2 tablets (1,000 mg total) by mouth every morning. 06/13/13   Clanford Marisa Hua, MD  esomeprazole (NEXIUM) 40 MG capsule Take 1 capsule (40 mg total) by mouth daily before breakfast. 07/19/13   Angelica Chessman, MD  ibuprofen (ADVIL,MOTRIN) 800 MG tablet Take 1 tablet (800 mg total) by  mouth every 6 (six) hours as needed for pain. For knee pain 07/19/13   Angelica Chessman, MD  naproxen (NAPROSYN) 500 MG tablet Take 1 tablet (500 mg total) by mouth 2 (two) times daily with a meal. 12/31/13   Angelica Chessman, MD  olmesartan-hydrochlorothiazide (BENICAR HCT) 20-12.5 MG per tablet Take 1 tablet by mouth daily. 09/18/13   Robbie Lis, MD  ondansetron (ZOFRAN) 4 MG tablet Take 1 tablet (4 mg total) by mouth every 4 (four) hours as needed for nausea. 04/12/13   Theodis Blaze, MD     Objective:   Filed Vitals:   12/31/13 1430 12/31/13 1433  BP:  103/72  Pulse:  83  Temp:  98.4 F (36.9 C)  TempSrc:  Oral  Resp:  16  Height: 5\' 2"  (1.575 m) 5\' 2"  (1.575 m)  Weight: 212 lb (96.163 kg) 212 lb (96.163 kg)  SpO2:  94%    Exam General appearance : Awake, alert, not in any distress. Speech Clear. Not toxic looking HEENT: Atraumatic and Normocephalic, pupils equally reactive to light and accomodation Neck: supple, no JVD. No cervical lymphadenopathy.  Chest:Good air entry bilaterally, no added sounds  CVS: S1 S2 regular, no murmurs.  Abdomen: Bowel sounds present, Non tender and not distended with no gaurding, rigidity or rebound. Extremities: Slightly swollen right great toe with limited range of motion due to pain. No evidence of infection. B/L  Lower Ext shows no edema, both legs are warm to touch Neurology: Awake alert, and oriented X 3, CN II-XII intact, Non focal Skin:No Rash Wounds:N/A  Data Review  Assessment & Plan   1. HTN (hypertension) Continue current regimen of meds Olmesartan-hydrochlorothiazide 20-25 mg tablet by mouth daily  2. Other convulsions Continue Depakote and carbamazepine Followup with neurologist  3. Pain of right great toe, I suspect fracture, if confirmed with x-ray, we'll refer to orthopedic surgery  - DG Foot Complete Right; Future  - acetaminophen-codeine (TYLENOL #3) 300-30 MG per tablet; Take 1 tablet by mouth every 4 (four) hours  as needed.  Dispense: 60 tablet; Refill: 0 - naproxen (NAPROSYN) 500 MG tablet; Take 1 tablet (500 mg total) by mouth 2 (two) times daily with a meal.  Dispense: 60 tablet; Refill: 0  Patient was counseled extensively on nutrition and exercise  Do not drive until cleared by neurologist   Return in about 3 months (around 03/30/2014), or if symptoms worsen or fail to improve, for Routine Follow Up.  The patient was given clear instructions to go to ER or return to medical center if symptoms don't improve, worsen or new problems develop. The patient verbalized understanding. The patient was told to call to get lab results if they haven't heard anything in the next week.    Angelica Chessman, MD, [place], [place], [place] and [place], [place]   12/31/2013, 3:58 PM

## 2014-01-14 ENCOUNTER — Ambulatory Visit (INDEPENDENT_AMBULATORY_CARE_PROVIDER_SITE_OTHER): Payer: No Typology Code available for payment source | Admitting: Family Medicine

## 2014-01-14 ENCOUNTER — Encounter: Payer: Self-pay | Admitting: Family Medicine

## 2014-01-14 VITALS — BP 120/85 | HR 80 | Ht 62.0 in | Wt 212.0 lb

## 2014-01-14 DIAGNOSIS — S8290XD Unspecified fracture of unspecified lower leg, subsequent encounter for closed fracture with routine healing: Secondary | ICD-10-CM

## 2014-01-14 DIAGNOSIS — S92919G Unspecified fracture of unspecified toe(s), subsequent encounter for fracture with delayed healing: Secondary | ICD-10-CM

## 2014-01-14 IMAGING — CT CT ABD-PELV W/ CM
2 of 5 series · 17 of 46 positions shown, 19 images · IV contrast (omnipaque)
Comparison: Acute abdominal series 04/23/2013.

CLINICAL DATA: Postprandial abdominal pain with absent bowel
movements for 3 weeks.

CT ABDOMEN AND PELVIS WITH CONTRAST
TECHNIQUE: Multidetector CT imaging of the abdomen and pelvis was
performed following the standard protocol during bolus
administration of intravenous contrast.
Contrast: 100mL OMNIPAQUE IOHEXOL 300 MG/ML  SOLN

[Series 2: routine · axial · 0.72mm/px · z∈[-455,-40]mm · 14 of 93 slices shown, 16 images]
[im 5/93  soft-tissue]
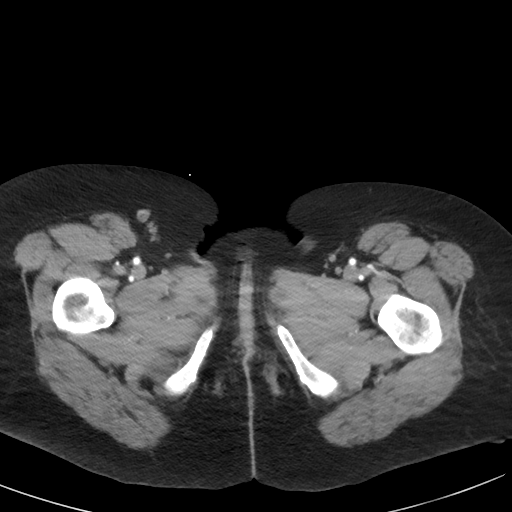
[im 5/93  bone]
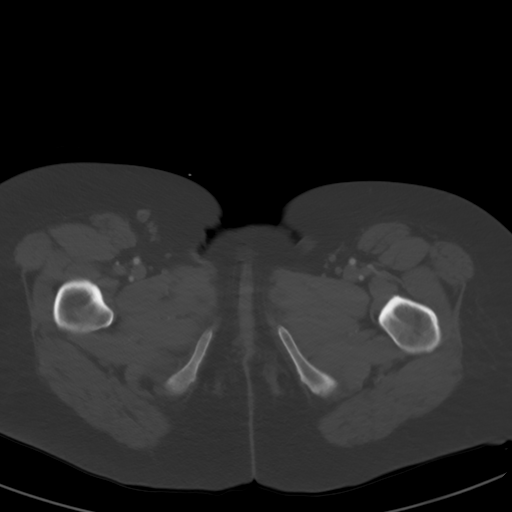
[im 14/93  soft-tissue]
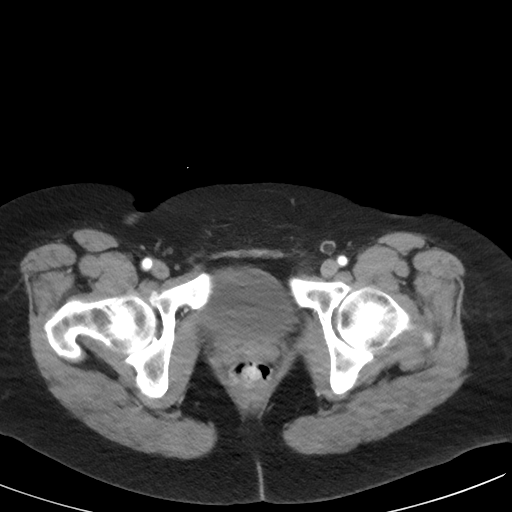
[im 19/93  soft-tissue]
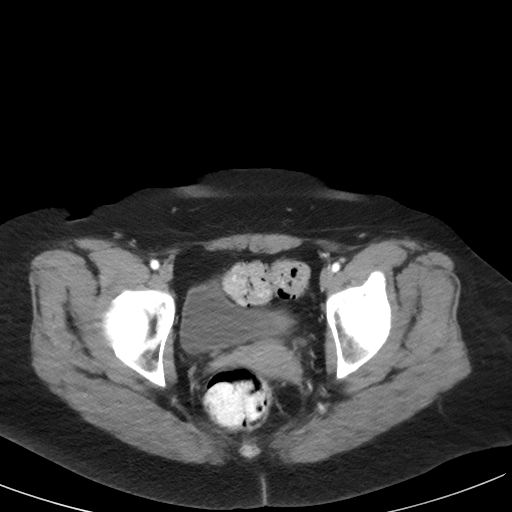
[im 24/93  soft-tissue]
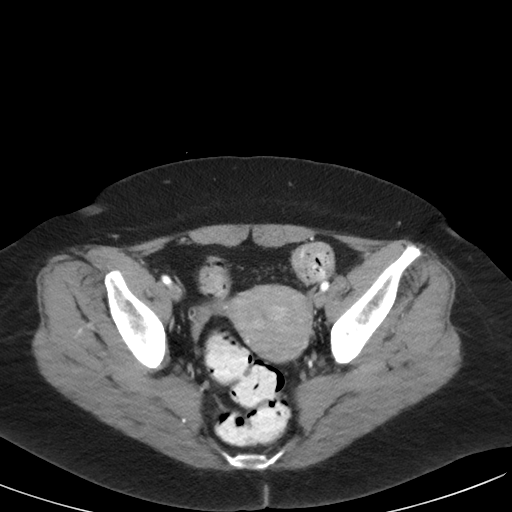
[im 33/93  soft-tissue]
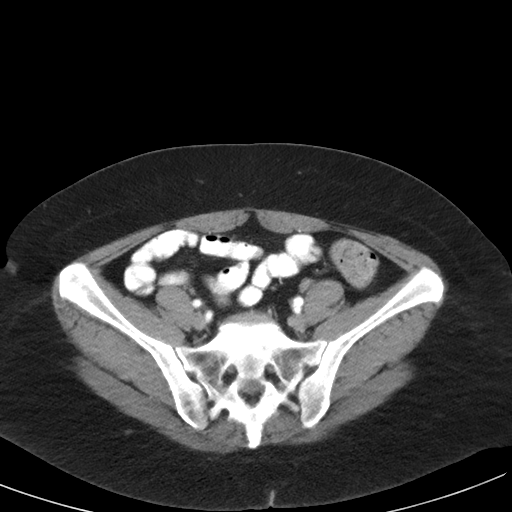
[im 37/93  soft-tissue]
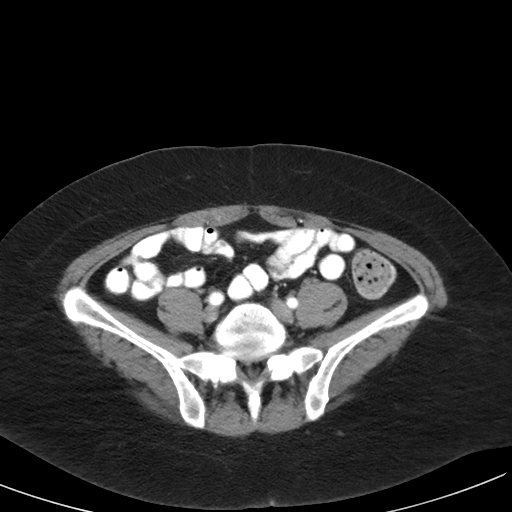
[im 42/93  soft-tissue]
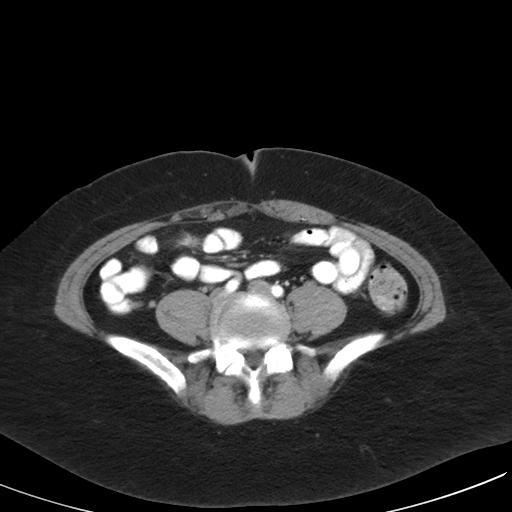
[im 51/93  soft-tissue]
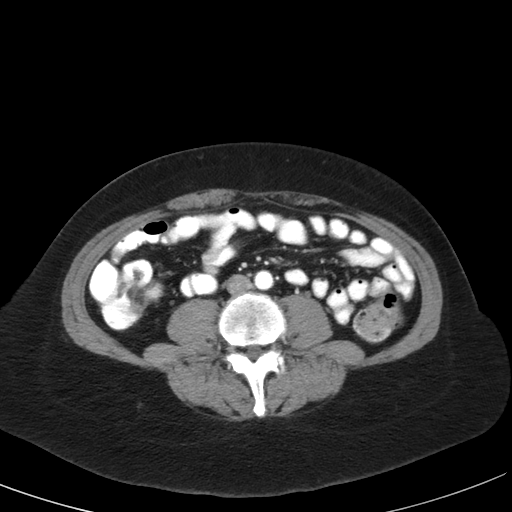
[im 56/93  soft-tissue]
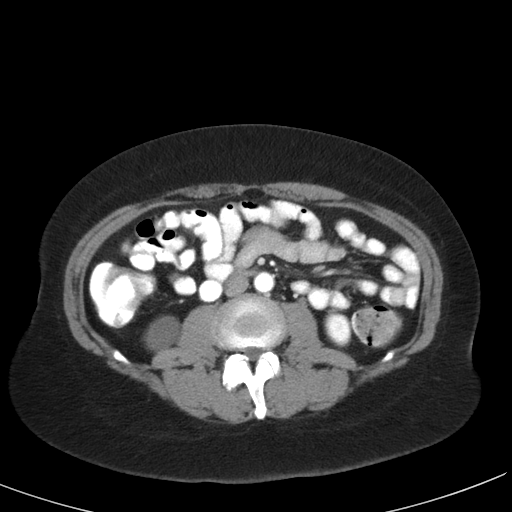
[im 56/93  bone]
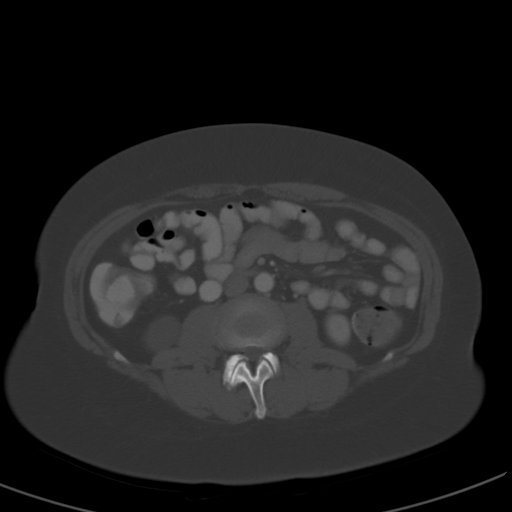
[im 60/93  soft-tissue]
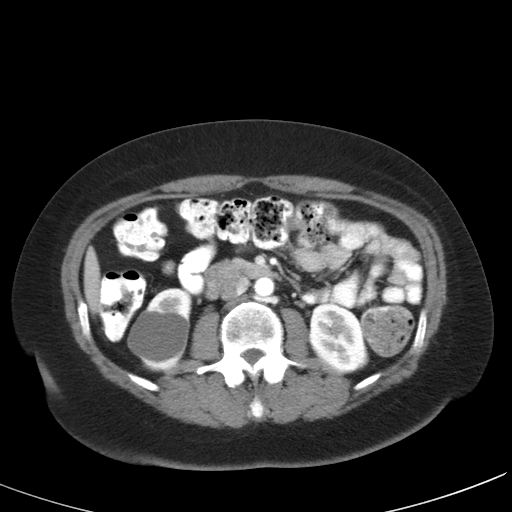
[im 70/93  soft-tissue]
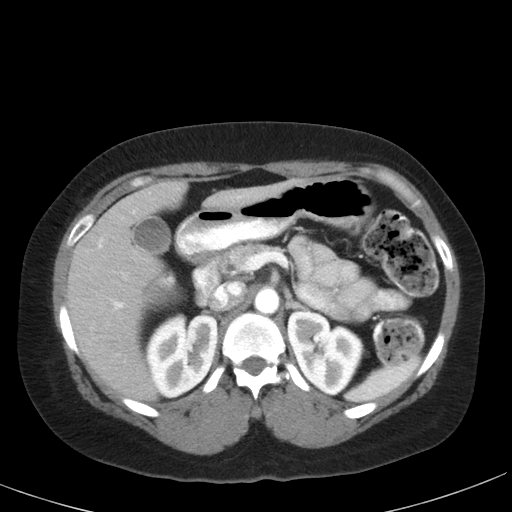
[im 74/93  soft-tissue]
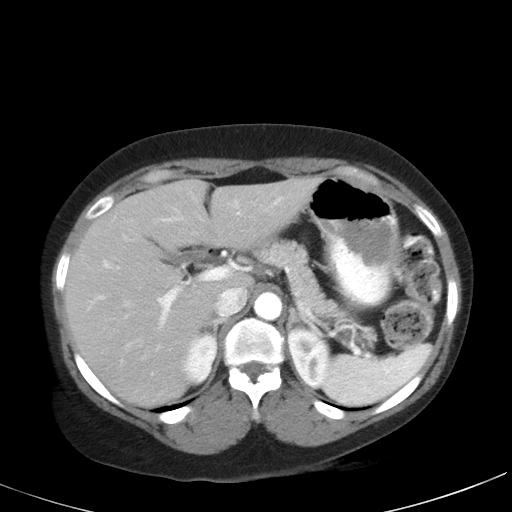
[im 79/93  soft-tissue]
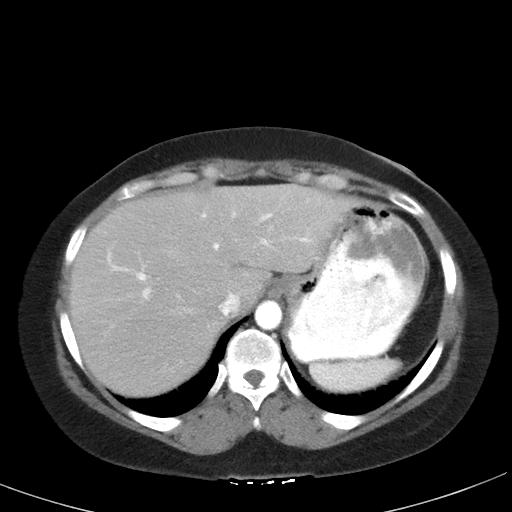
[im 88/93  soft-tissue]
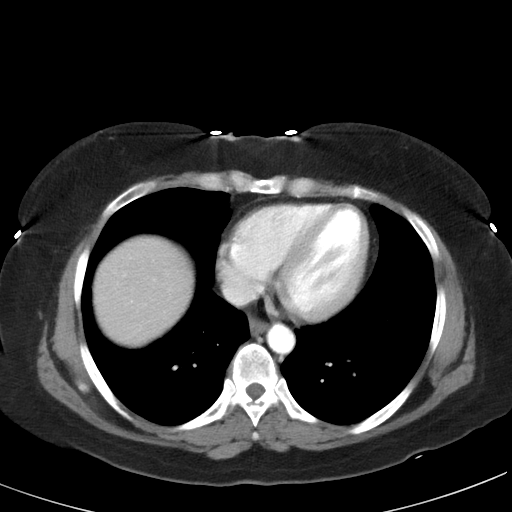

[coronals · coronal · 0.90mm/px · 3 of 88 slices shown]
[im 30/88  soft-tissue]
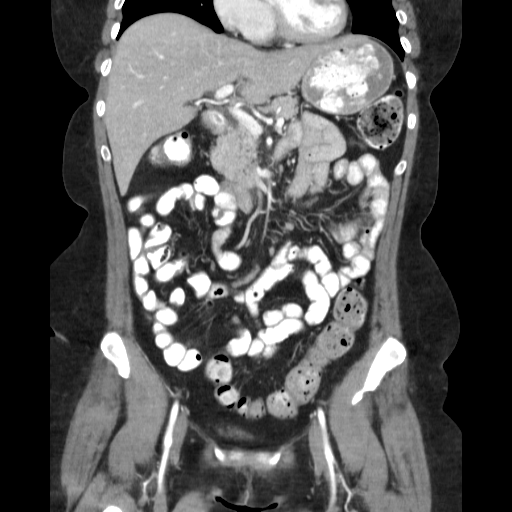
[im 39/88  soft-tissue]
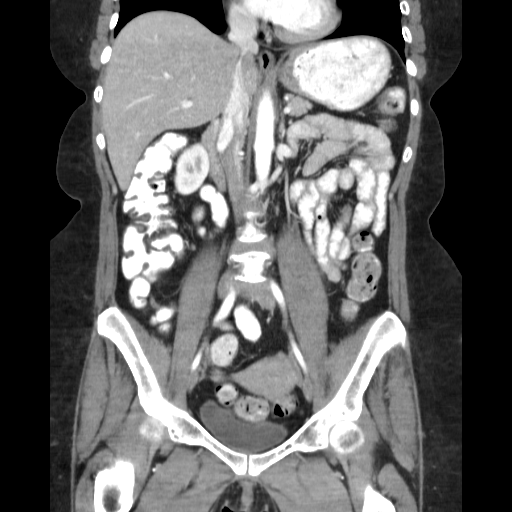
[im 49/88  soft-tissue]
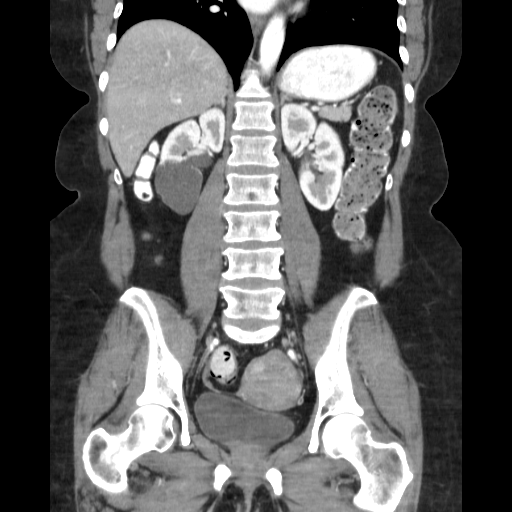

[17 of 46 positions shown; findings below may reference images not displayed]

FINDINGS: The lung bases are clear.  There is no pleural or
pericardial effusion.

There is focal fat in the left hepatic lobe adjacent to the
falciform ligament.  There are no suspicious liver lesions.  The
gallbladder, biliary system and pancreas appear normal.  The spleen
and adrenal glands appear normal.  There is a dominant cyst
involving the lower pole of the right kidney, measuring 4.6 cm in
diameter.  Additional tiny low density renal lesions are likely
cysts as well.  There is no hydronephrosis.

No abnormalities of the stomach, small bowel or appendix are
identified.  There is stool throughout the colon and mild sigmoid
colon diverticulosis.  There is no surrounding inflammatory change.
There are no enlarged abdominal pelvic lymph nodes.

Multi focal nodular enhancement is present within the uterus
consistent with fibroids.  There is no adnexal mass.  The bladder
appears normal.

There are no worrisome osseous findings.
IMPRESSION: 1.  No acute abdominal pelvic findings identified.
2.  Renal cysts, the largest in the lower pole of the right kidney.
3.  Uterine fibroids.

## 2014-01-14 NOTE — Progress Notes (Signed)
   Subjective:    Patient ID: Amanda Roach, female    DOB: June 30, 1956, 58 y.o.   MRN: [id number]  HPI  Right great toe pain. She had accident in January were she fell off a chair, landed on her toe. Had some x-rays done which showed a fracture. She is here for further evaluation and management. She's essentially had no improvement with conservative care. She has not been wearing any type of orthotic or postop shoe. She has taken some Tylenol and ibuprofen with some mild pain relief. Her biggest problem is trying to walk for any length of time. This seems to cause her significant pain. She said she change her activities.  PERTINENT  PMH / PSH: Seizure disorder, no personal history of diabetes mellitus. Nonsmoker. Review of Systems No foot numbness. No fever, sweats, chills.    Objective:   Physical Exam Vital signs are reviewed GENERAL: Well-developed female no acute distress FOOT: Right. No ecchymoses are noted. Normal skin, normal capillary refill, normal soft touch sensation to soft touch was normal 2. discrimination. Right great toe tender to palpation. She has full flexion and extension at the first MTP. Tender over the proximal phalanx. IMAGING: Nondisplaced fracture proximal phalanx right great toe. I see no evidence of a callus or other sign of healing.  Procedure note: Fabricated a plaster splint for her foot.Plaster  molded to the bottom medial side of her foot. We then placed some blue foam padding on top of this and applied it with an Ace bandage.       Assessment & Plan:  Proximal phalanx fracture. He has not heal because she's not been keeping it stable. She has no insurance and very little financial assistance so she's not able to by postop shoe. A manufacture of custom molded orthotic splint for her today. We'll see her back in 2 weeks. She'll call me in her more problems.

## 2014-02-04 ENCOUNTER — Ambulatory Visit (INDEPENDENT_AMBULATORY_CARE_PROVIDER_SITE_OTHER): Payer: No Typology Code available for payment source | Admitting: Family Medicine

## 2014-02-04 ENCOUNTER — Encounter: Payer: Self-pay | Admitting: Family Medicine

## 2014-02-04 VITALS — BP 116/80 | Ht 61.0 in | Wt 230.0 lb

## 2014-02-04 DIAGNOSIS — M1710 Unilateral primary osteoarthritis, unspecified knee: Secondary | ICD-10-CM

## 2014-02-04 DIAGNOSIS — IMO0002 Reserved for concepts with insufficient information to code with codable children: Secondary | ICD-10-CM

## 2014-02-04 DIAGNOSIS — S92919G Unspecified fracture of unspecified toe(s), subsequent encounter for fracture with delayed healing: Secondary | ICD-10-CM

## 2014-02-04 DIAGNOSIS — M1711 Unilateral primary osteoarthritis, right knee: Secondary | ICD-10-CM

## 2014-02-04 DIAGNOSIS — S8290XD Unspecified fracture of unspecified lower leg, subsequent encounter for closed fracture with routine healing: Secondary | ICD-10-CM

## 2014-02-06 NOTE — Progress Notes (Signed)
   Subjective:    Patient ID: Amanda Roach, female    DOB: October 13, 1956, 58 y.o.   MRN: [id number]  HPI Followup great right toe fracture. I have placed her in a foot orthosis at last visit. That seemed to help. She's had about 25% improvement. She does think orthosis he is to replaced. Review of Systems Denies fever, sweats, chills.    Objective:   Physical Exam   Vital signs are Reviewed GENERAL: Well-developed female no acute distress in FOOT: Right. Still tender to palpation over the great toe IP joint and MTP joint. There is no swelling or erythema here. She has normal flexion extension of these joints. The foot is neurovascularly intact. ULTRASOUND: The area of fracture has some increased [place] the around that area.        Assessment & Plan:  Proximal phalanx fracture right great toe. Worse ptotic does need to be replaced. I placed her in a shank in her shoe today. She cannot afford a postop shoe. She'll weight bear as tolerated using crutches I'll see her back in 2-3 weeks.

## 2014-02-18 ENCOUNTER — Ambulatory Visit: Payer: No Typology Code available for payment source | Admitting: Family Medicine

## 2014-02-25 ENCOUNTER — Telehealth: Payer: Self-pay | Admitting: Internal Medicine

## 2014-02-25 NOTE — Telephone Encounter (Signed)
Pt has come in today requesting a refill for scripts carbamazepine (TEGRETOL) 200 MG tablet and divalproex (DEPAKOTE) 500 MG DR tablet; Pt says she has left multiple messages trying to get in contact with the clinic; please f/u with pt

## 2014-03-01 ENCOUNTER — Ambulatory Visit (INDEPENDENT_AMBULATORY_CARE_PROVIDER_SITE_OTHER): Payer: No Typology Code available for payment source | Admitting: Family Medicine

## 2014-03-01 ENCOUNTER — Encounter: Payer: Self-pay | Admitting: Family Medicine

## 2014-03-01 VITALS — BP 120/80 | Ht 62.0 in | Wt 223.0 lb

## 2014-03-01 DIAGNOSIS — S92919A Unspecified fracture of unspecified toe(s), initial encounter for closed fracture: Secondary | ICD-10-CM

## 2014-03-01 NOTE — Patient Instructions (Addendum)
Thank you for coming in today  1. Calcium 1000-1200 mg per day 2. Vitamin D 1000-2000 units per day 3. Continue shoe insert 4. Tylenol extra strength up to 1000 mg (2 tablets) three times per day  Followup 3-4 weeks

## 2014-03-01 NOTE — Progress Notes (Signed)
CC: Followup right great toe pain HPI: Patient presents for followup of right great toe comminuted proximal phalanx fracture. She has been wearing the metal shank in her shoe with some improvement of her symptoms. Overall she is improving. She cannot tolerate the naproxen due to GI side effect.  ROS: As above in the HPI. All other systems are stable or negative.  OBJECTIVE: APPEARANCE:  Patient in no acute distress.The patient appeared well nourished and normally developed. HEENT: No scleral icterus. Conjunctiva non-injected Resp: Non labored Skin: No rash MSK:  Right great toe: - No tenderness at distal phalanx or IP joint - Tenderness over the proximal phalanx - Pain with first MTP mobilization with hallux rigidus   MSK [place]: Not performed   ASSESSMENT: #1. Great toe proximal phalanx comminuted fracture with delayed healing, now improving  PLAN: Continue metal shank in shoe. Tylenol up to 1000 mg 3 times a day as needed for pain. Recommended calcium and vitamin D. Followup 3-4 weeks.

## 2014-03-07 ENCOUNTER — Telehealth: Payer: Self-pay | Admitting: Internal Medicine

## 2014-03-07 DIAGNOSIS — M1711 Unilateral primary osteoarthritis, right knee: Secondary | ICD-10-CM

## 2014-03-07 DIAGNOSIS — I10 Essential (primary) hypertension: Secondary | ICD-10-CM

## 2014-03-07 DIAGNOSIS — R569 Unspecified convulsions: Secondary | ICD-10-CM

## 2014-03-07 MED ORDER — ESOMEPRAZOLE MAGNESIUM 40 MG PO CPDR: 40.0000 mg | DELAYED_RELEASE_CAPSULE | Freq: Every day | ORAL | Status: DC

## 2014-03-07 MED ORDER — CARBAMAZEPINE 200 MG PO TABS: 400.0000 mg | ORAL_TABLET | Freq: Two times a day (BID) | ORAL | Status: DC

## 2014-03-07 MED ORDER — OLMESARTAN MEDOXOMIL-HCTZ 20-12.5 MG PO TABS: 1.0000 | ORAL_TABLET | Freq: Every day | ORAL | Status: DC

## 2014-03-07 MED ORDER — DIVALPROEX SODIUM 500 MG PO DR TAB: 1000.0000 mg | DELAYED_RELEASE_TABLET | Freq: Every morning | ORAL | Status: DC

## 2014-03-07 NOTE — Telephone Encounter (Signed)
Patient requesting medications to be refilled. Will refill patients medications one time. Has an appointment on 04/04/2014. Will call patient and inform her of refill and to keep her follow appointment.

## 2014-03-07 NOTE — Telephone Encounter (Signed)
Pt was last seen on 12/31/13 for office visit, says she is almost out of medications and has not been contacted even though she has called and message was sent to nurse/Dr on 02/25/14.  Pt unsure whether she can/needs to come in for walkin appt or can meds be sent to pharmacy.  Please f/u with pt.

## 2014-03-18 ENCOUNTER — Telehealth: Payer: Self-pay | Admitting: Internal Medicine

## 2014-03-18 NOTE — Telephone Encounter (Signed)
PT. Needs approval for Effexor XR...fax has been sent by the Health Department last week... Please follow up with patient in regards to this request...Marland KitchenMarland Kitchen

## 2014-03-22 ENCOUNTER — Telehealth: Payer: Self-pay | Admitting: *Deleted

## 2014-03-22 ENCOUNTER — Encounter: Payer: Self-pay | Admitting: Family Medicine

## 2014-03-22 ENCOUNTER — Ambulatory Visit (INDEPENDENT_AMBULATORY_CARE_PROVIDER_SITE_OTHER): Payer: No Typology Code available for payment source | Admitting: Family Medicine

## 2014-03-22 ENCOUNTER — Telehealth: Payer: Self-pay

## 2014-03-22 VITALS — BP 109/73 | Ht 62.0 in | Wt 223.0 lb

## 2014-03-22 DIAGNOSIS — S8290XD Unspecified fracture of unspecified lower leg, subsequent encounter for closed fracture with routine healing: Secondary | ICD-10-CM

## 2014-03-22 DIAGNOSIS — S92919G Unspecified fracture of unspecified toe(s), subsequent encounter for fracture with delayed healing: Secondary | ICD-10-CM

## 2014-03-22 NOTE — Telephone Encounter (Signed)
Spoke with Amanda Roach from the health department Wanted [place] to be aware patient was prescribed Effexor XR 37.5mg  from a physician at [facility] also Seroquel from [facility] [place] did not feel comfortable filling the prescriptions because there were no refill on the Effexor And could not explain why she was taking the Seroquel

## 2014-03-22 NOTE — Assessment & Plan Note (Signed)
Continues to improve. Now 70-90% back to baseline. She can wear the support in her shoe when necessary. I would start her on some exercises including toe grabs with marbles or pieces of sponge as well as daily rolling the foot over an iced soda bottle to increase flexion at the MTP. Followup when necessary

## 2014-03-22 NOTE — Progress Notes (Signed)
Patient ID: Amanda Roach, female   DOB: Mar 11, 1956, 58 y.o.   MRN: [id number]  [name] - 58 y.o. female MRN [id number]  Date of birth: [birth year]  CC: Toe pain  SUBJECTIVE:     Follow up fracture. She continues to improve. Overall she's about 75-90% better. Some days are better than others. Has continued to wear the support in her shoe. Not having awakening at night secondary to pain.  ROS:     No fever, sweats, chills, unusual weight change.  HISTORY:  Past Medical, Surgical, Social, and Family History Reviewed & Updated per EMR.  Pertinent Historical Findings include:  No personal history diabetes mellitus, nonsmoker. She does have history of seizure disorder.  OBJECTIVE: BP 109/73  Ht 5\' 2"  (1.575 m)  Wt 223 lb (101.152 kg)  BMI 40.78 kg/m2  Physical Exam: Vital signs reviewed GENERAL: Well-developed overweight female no acute distress FOOT: Right. Very slight tenderness to palpation over the right great MTP but significantly improved. Some stiffness with flexion essentially full range of motion. There is no swelling. Vascularly she is intact with dorsalis pedis pulses 2+ bilaterally equal  ASSESSMENT & PLAN: See problem based charting & AVS for pt instructions.

## 2014-03-22 NOTE — Telephone Encounter (Signed)
Flanagan states patient was on Tegretol XR 400 mg. Patient prescription is for Tegretol 200 mg (2 tablets by mouth 2 times daily). Pharmacy wants to know if the XR 400 mg dose has been discontinued? Alverda Skeans, RN

## 2014-04-04 ENCOUNTER — Encounter: Payer: Self-pay | Admitting: Internal Medicine

## 2014-04-04 ENCOUNTER — Telehealth: Payer: Self-pay | Admitting: Emergency Medicine

## 2014-04-04 ENCOUNTER — Ambulatory Visit: Payer: Self-pay | Attending: Internal Medicine | Admitting: Internal Medicine

## 2014-04-04 VITALS — BP 128/85 | HR 62 | Temp 98.2°F | Resp 16 | Ht 61.0 in | Wt 212.0 lb

## 2014-04-04 DIAGNOSIS — R413 Other amnesia: Secondary | ICD-10-CM

## 2014-04-04 DIAGNOSIS — I10 Essential (primary) hypertension: Secondary | ICD-10-CM

## 2014-04-04 DIAGNOSIS — M25569 Pain in unspecified knee: Secondary | ICD-10-CM | POA: Insufficient documentation

## 2014-04-04 DIAGNOSIS — Z79899 Other long term (current) drug therapy: Secondary | ICD-10-CM | POA: Insufficient documentation

## 2014-04-04 DIAGNOSIS — R569 Unspecified convulsions: Secondary | ICD-10-CM

## 2014-04-04 NOTE — Progress Notes (Signed)
Patient ID: Amanda Roach, female   DOB: 08/16/1956, 58 y.o.   MRN: [id number]  CC: f/u  HPI: Patient is here today for a follow up visit.  Patient reports that she needs a refill on her seizure medication.  Patient reports that she is followed by Dr. Dione Booze at [facility].  Needs EEG but cost her $300.  She states that she is having problems paying for her follow up visit at the neurology and would like to be seen by one that participates with the orange card program.  Patient reports that she has memory issues and cannot remember why she did not get a refill on her seizure medication at her last visit with neurologist.  Patient is unaware if she has been getting medication blood levels drawn at the neurologist. Patient state that her sister is usually with her to help manage her doctor visits and give information but was unable to attend today. Patient is [facility] Metta Clines [facility].  Is unsure if she has had a seizure since last visit. She needs refills of antihypertensive medication.   No Known Allergies Past Medical History  Diagnosis Date  . Seizure   . Hypertension   . Knee pain   . Asthma    Current Outpatient Prescriptions on File Prior to Visit  Medication Sig Dispense Refill  . acetaminophen-codeine (TYLENOL #3) 300-30 MG per tablet Take 1 tablet by mouth every 4 (four) hours as needed.  60 tablet  0  . carbamazepine (TEGRETOL) 200 MG tablet Take 2 tablets (400 mg total) by mouth 2 (two) times daily.  120 tablet  0  . divalproex (DEPAKOTE) 500 MG DR tablet Take 2 tablets (1,000 mg total) by mouth every morning.  60 tablet  0  . esomeprazole (NEXIUM) 40 MG capsule Take 1 capsule (40 mg total) by mouth daily before breakfast.  30 capsule  0  . olmesartan-hydrochlorothiazide (BENICAR HCT) 20-12.5 MG per tablet Take 1 tablet by mouth daily.  30 tablet  0  . ondansetron (ZOFRAN) 4 MG tablet Take 1 tablet (4 mg total) by mouth every 4 (four)  hours as needed for nausea.  65 tablet  3  . ibuprofen (ADVIL,MOTRIN) 800 MG tablet Take 1 tablet (800 mg total) by mouth every 6 (six) hours as needed for pain. For knee pain  60 tablet  1  . naproxen (NAPROSYN) 500 MG tablet Take 1 tablet (500 mg total) by mouth 2 (two) times daily with a meal.  60 tablet  0   No current facility-administered medications on file prior to visit.   History reviewed. No pertinent family history. History   Social History  . Marital Status: Divorced    Spouse Name: N/A    Number of Children: N/A  . Years of Education: N/A   Occupational History  . Not on file.   Social History Main Topics  . Smoking status: Never Smoker   . Smokeless tobacco: Not on file  . Alcohol Use: No  . Drug Use: No  . Sexual Activity: Not on file   Other Topics Concern  . Not on file   Social History Narrative  . No narrative on file    Review of Systems: Unable to get d/t memory issues   Objective:   Filed Vitals:   04/04/14 1427  BP: 128/85  Pulse: 62  Temp: 98.2 F (36.8 C)  Resp: 16   Physical Exam  Vitals reviewed. Constitutional: She  is oriented to person, place, and time. She appears well-nourished.  HENT:  Right Ear: External ear normal.  Left Ear: External ear normal.  Eyes: Conjunctivae and EOM are normal. Pupils are equal, round, and reactive to light.  Neck: Normal range of motion. Neck supple. No JVD present.  Cardiovascular: Normal rate, regular rhythm and normal heart sounds.   Pulmonary/Chest: Effort normal and breath sounds normal.  Abdominal: Soft. Bowel sounds are normal. She exhibits no distension. There is no tenderness.  Musculoskeletal: Normal range of motion. She exhibits no edema and no tenderness.  Neurological: She is alert and oriented to person, place, and time. She has normal reflexes. No cranial nerve deficit.  Poor memory   Skin: Skin is warm and dry.  Psychiatric: She has a normal mood and affect. Her behavior is  normal.     Lab Results  Component Value Date   WBC 3.7* 04/23/2013   HGB 13.5 04/23/2013   HCT 39.6 04/23/2013   MCV 87.4 04/23/2013   PLT 210 04/23/2013   Lab Results  Component Value Date   CREATININE 0.98 04/23/2013   BUN 7 04/23/2013   NA 142 04/23/2013   K 3.9 04/23/2013   CL 103 04/23/2013   CO2 30 04/23/2013    No results found for this basename: HGBA1C   Lipid Panel  No results found for this basename: chol, trig, hdl, cholhdl, vldl, ldlcalc       Assessment and plan:   Monaye was seen today for follow-up.  Diagnoses and associated orders for this visit:  Seizures - Valproic acid level - Carbamazepine level, total Waiting for records from Neurologist, and figure out why her medication is not being managed by Neurolgy.  Will recheck levels next month and send neurology referral if needed.   Memaory loss Patient will need to bring sister to next appointment to assist with interviewing and obtaining pertinent information. HTN (hypertension) Continue with current regimen and dash diet  Return in about 4 weeks (around 05/02/2014) for drug levels, 3 months PCP.       Lance Bosch, [facility] [phone] 04/04/2014, 3:15 PM

## 2014-04-04 NOTE — Telephone Encounter (Signed)
Spoke to [facility] in regards to pt seizure medication management. Message sent to nurse for further information.

## 2014-04-04 NOTE — Progress Notes (Signed)
Pt is here following up on her HTN and her right great broken toe. Pt states that she needs all her medications refilled.

## 2014-04-05 ENCOUNTER — Telehealth: Payer: Self-pay | Admitting: Emergency Medicine

## 2014-04-05 LAB — CARBAMAZEPINE LEVEL, TOTAL: Carbamazepine Lvl: 9.2 ug/mL (ref 4.0–12.0)

## 2014-04-05 LAB — VALPROIC ACID LEVEL: Valproic Acid Lvl: 96 ug/mL (ref 50.0–100.0)

## 2014-04-05 NOTE — Telephone Encounter (Signed)
Spoke with pt and gave lab results. Pt states she needs to have EEG done but will cost her $300 ,not covered with orange card. Pt also need refill on anti-seizure med. Informed pt we will call prior Neurology office to check management status of meds

## 2014-04-05 NOTE — Telephone Encounter (Signed)
Message copied by Ricci Barker on Fri Apr 05, 2014  5:40 PM ------      Message from: Chari Manning A      Created: Fri Apr 05, 2014  4:39 PM       Let patient know her seizure medication drug levels are normal. If she needs refills may send it. Check medication dose first, I believe one of the meds are a different dose than what is on file.  Thanks. Please get records from Neurology or find out if they are managing her meds. Dr Sherril Cong with [facility]. ------

## 2014-04-08 ENCOUNTER — Other Ambulatory Visit: Payer: Self-pay | Admitting: Internal Medicine

## 2014-04-08 DIAGNOSIS — R569 Unspecified convulsions: Secondary | ICD-10-CM

## 2014-04-09 ENCOUNTER — Telehealth: Payer: Self-pay | Admitting: Emergency Medicine

## 2014-04-09 NOTE — Telephone Encounter (Signed)
Returned message to [facility] Dr. Dione Booze office in regards to pt anti seizure/blood work Mudlogger. Dr. Dione Booze left message with staff that he can treat pt for seizure disorder but will need orders from provider. Pt already has scheduled appt with Neurologist, Dr. Delice Lesch 04/18/14 for long term seizure disorder- Candie Chroman, RN

## 2014-04-11 ENCOUNTER — Encounter: Payer: Self-pay | Admitting: *Deleted

## 2014-04-11 NOTE — Progress Notes (Unsigned)
Received a fax from [place] for medication refill on Depakote 500 mg (take one tablet by mouth 2 times a day). Patient was seen 04/04/2014 please advise if you would like to refill.

## 2014-04-11 NOTE — Progress Notes (Signed)
You may send this for patient.

## 2014-04-12 ENCOUNTER — Other Ambulatory Visit: Payer: Self-pay | Admitting: *Deleted

## 2014-04-12 DIAGNOSIS — R569 Unspecified convulsions: Secondary | ICD-10-CM

## 2014-04-12 MED ORDER — DIVALPROEX SODIUM 500 MG PO DR TAB: 500.0000 mg | DELAYED_RELEASE_TABLET | Freq: Two times a day (BID) | ORAL | Status: DC

## 2014-04-17 ENCOUNTER — Ambulatory Visit: Payer: Self-pay | Admitting: Neurology

## 2014-04-18 ENCOUNTER — Ambulatory Visit (INDEPENDENT_AMBULATORY_CARE_PROVIDER_SITE_OTHER): Payer: Self-pay | Admitting: Neurology

## 2014-04-18 ENCOUNTER — Encounter: Payer: Self-pay | Admitting: Neurology

## 2014-04-18 VITALS — BP 130/80 | HR 78 | Temp 98.2°F | Ht 61.0 in | Wt 213.0 lb

## 2014-04-18 DIAGNOSIS — R569 Unspecified convulsions: Secondary | ICD-10-CM

## 2014-04-18 DIAGNOSIS — R413 Other amnesia: Secondary | ICD-10-CM

## 2014-04-18 MED ORDER — CARBAMAZEPINE ER 400 MG PO TB12: ORAL_TABLET | ORAL | Status: DC

## 2014-04-18 MED ORDER — DIVALPROEX SODIUM ER 500 MG PO TB24: ORAL_TABLET | ORAL | Status: DC

## 2014-04-18 NOTE — Patient Instructions (Signed)
1. Routine EEG 2. Continue current doses of Depakote and Tegretol XR for now 3. Continue with keeping calendar

## 2014-04-18 NOTE — Progress Notes (Signed)
NEUROLOGY CONSULTATION NOTE  Amanda Roach MRN: [id number] DOB: Jul 21, 1956  Referring provider: Chari Manning, NP Primary care provider: Chari Manning, NP  Reason for consult:  Seizures, memory loss  Thank you for your kind referral of Amanda Roach for consultation of the above symptoms. Although her history is well known to you, please allow me to reiterate it for the purpose of our medical record. The patient was accompanied to the clinic by her sister who also provides collateral information. Records and images were personally reviewed where available.  HISTORY OF PRESENT ILLNESS: This is a 58 year old right-handed woman with a history of hypertension, depression, and seizures since age 48 or 31, presenting for worsening memory and continued seizures.  She and her sister report seizures started at age 28 or 50, she has "petit" ones and generalized convulsions.  The "petit" seizures consist of staring and unresponsiveness with slight shaking of the head lasting 1 minutes, sometimes occurring in clusters of 3 ro 4 within a 3 hour interval.  They deny any lip smacking, dystonic posturing, or automatisms. She has had urinary incontinence and some tongue bite with these.  She knows when she is having one, she feels her head turning to the right but cannot control it, loses focus, forgetting what she was doing, followed by generalized weakness.  She lives alone and reports having the petit ones 2-3 times daily, last episode was yesterday.  She denies any generalized convulsions in at least 20 years.  Her sister describes right head version with some of the convulsions. Has been taking Depakote DR 500mg  2 tabs in AM and Tegretol XR 400mg  2 tabs qhs.  She recalls trying Dilantin and Phenobarbital when younger.   She denies any gustatory hallucinations, deja vu, rising epigastric sensation, focal numbness/tingling/weakness.  She has had body jerks since childhood.  She has told her sister she has smelled  "poop" several times.    She and her sister report that she has always had memory problems as a child, but this has greatly increased in the past 20 years.  She would say something that didn't happen, or forget things she was supposed to do.  Her sister is concerned about how she had given her phone number to strangers and could not recall if she had given them the wrong number.  She lives alone but family has stayed in contact with her daily.  She would get paranoid if they do not call her.  She has been working with her therapist on this, and has a schedule book.  She reports that her mood is really good some days, but other days "it's just no good," she sits and is "totally depressed."  She can get very angry and violent at times. She denies any suicidal ideation. She has been taking Seroquel for the past 3 months.  She has frequent headaches over the bitemporal and frontal region, with throbbing associated with dizziness, blurred vision, nausea and photophobia.  Headaches started in childhood, she reports having 4 headaches a month and does not take any medication. Triggers include loud noises and blinking lights.  She has occasional tingling in both feet.  She denies any diplopia, dysarthria, dysphagia, neck/back pain, bowel/bladder dysfunction.  She had a college degree in music. She is not driving.  Epilepsy Risk Factors:  Her father and 2 sisters had seizures in childhood.  Otherwise she had a normal birth and early development.  There is no history of febrile convulsions, CNS infections  such as meningitis/encephalitis, significant traumatic brain injury, neurosurgical procedures  Laboratory Data: Lab Results  Component Value Date   WBC 3.7* 04/23/2013   HGB 13.5 04/23/2013   HCT 39.6 04/23/2013   MCV 87.4 04/23/2013   PLT 210 04/23/2013     Chemistry      Component Value Date/Time   NA 142 04/23/2013 1306   K 3.9 04/23/2013 1306   CL 103 04/23/2013 1306   CO2 30 04/23/2013 1306   BUN 7  04/23/2013 1306   CREATININE 0.98 04/23/2013 1306   CREATININE 0.89 04/12/2013 1126      Component Value Date/Time   CALCIUM 9.4 04/23/2013 1306   ALKPHOS 70 04/23/2013 1306   AST 13 04/23/2013 1306   ALT 7 04/23/2013 1306   BILITOT 0.2* 04/23/2013 1306     Lab Results  Component Value Date   VALPROATE 96.0 04/04/2014   CBMZ 9.2 04/04/2014    PAST MEDICAL HISTORY: Past Medical History  Diagnosis Date  . Seizure   . Hypertension   . Knee pain   . Asthma     PAST SURGICAL HISTORY: Past Surgical History  Procedure Laterality Date  . Breast surgery      breast reduction  . Esophagogastroduodenoscopy N/A 04/30/2013    Procedure: ESOPHAGOGASTRODUODENOSCOPY (EGD);  Surgeon: Jeryl Columbia, MD;  Location: [facility] ENDOSCOPY;  Service: Endoscopy;  Laterality: N/A;  barb Greggory Brandy    MEDICATIONS: Current Outpatient Prescriptions on File Prior to Visit  Medication Sig Dispense Refill  . acetaminophen-codeine (TYLENOL #3) 300-30 MG per tablet Take 1 tablet by mouth every 4 (four) hours as needed.  60 tablet  0  . esomeprazole (NEXIUM) 40 MG capsule Take 1 capsule (40 mg total) by mouth daily before breakfast.  30 capsule  0  . olmesartan-hydrochlorothiazide (BENICAR HCT) 20-12.5 MG per tablet Take 1 tablet by mouth daily.  30 tablet  0  . QUEtiapine (SEROQUEL) 400 MG tablet Take 400 mg by mouth at bedtime.       No current facility-administered medications on file prior to visit.    ALLERGIES: No Known Allergies  FAMILY HISTORY: History reviewed. No pertinent family history.  SOCIAL HISTORY: History   Social History  . Marital Status: Divorced    Spouse Name: N/A    Number of Children: N/A  . Years of Education: N/A   Occupational History  . Not on file.   Social History Main Topics  . Smoking status: Never Smoker   . Smokeless tobacco: Not on file  . Alcohol Use: No  . Drug Use: No  . Sexual Activity: Not on file   Other Topics Concern  . Not on file   Social History Narrative    . No narrative on file    REVIEW OF SYSTEMS: Constitutional: No fevers, chills, or sweats, no generalized fatigue, change in appetite Eyes: No visual changes, double vision, eye pain Ear, nose and throat: No hearing loss, ear pain, nasal congestion, sore throat Cardiovascular: No chest pain, palpitations Respiratory:  No shortness of breath at rest or with exertion, wheezes GastrointestinaI: No nausea, vomiting, diarrhea, abdominal pain, fecal incontinence Genitourinary:  No dysuria, urinary retention or frequency Musculoskeletal:  No neck pain, back pain Integumentary: No rash, pruritus, skin lesions Neurological: as above Psychiatric: + depression, insomnia, anxiety Endocrine: No palpitations, fatigue, diaphoresis, mood swings, change in appetite, change in weight, increased thirst Hematologic/Lymphatic:  No anemia, purpura, petechiae. Allergic/Immunologic: no itchy/runny eyes, nasal congestion, recent allergic reactions, rashes  PHYSICAL EXAM: Filed  Vitals:   04/18/14 0938  BP: 130/80  Pulse: 78  Temp: 98.2 F (36.8 C)   General: No acute distress, flat affect with poor eye contact Head:  Normocephalic/atraumatic Eyes: Fundoscopic exam shows bilateral sharp discs, no vessel changes, exudates, or hemorrhages Neck: supple, no paraspinal tenderness, full range of motion Back: No paraspinal tenderness Heart: regular rate and rhythm Lungs: Clear to auscultation bilaterally. Vascular: No carotid bruits. Skin/Extremities: No rash, no edema Neurological Exam: Mental status: alert and oriented to person, place, and time, no dysarthria or aphasia, Fund of knowledge is appropriate.  Recent and remote memory are intact.  Attention and concentration are normal.    Able to name objects and repeat phrases. MMSE 26/30 (missed 1 point for orientation, 1 for spelling WORLD backward, and 2 for delayed recall) Cranial nerves: CN I: not tested CN II: pupils equal, round and reactive to light,  visual fields intact, fundi unremarkable. CN III, IV, VI:  full range of motion, no nystagmus, no ptosis CN V: facial sensation intact CN VII: upper and lower face symmetric CN VIII: hearing intact to finger rub CN IX, X: gag intact, uvula midline CN XI: sternocleidomastoid and trapezius muscles intact CN XII: tongue midline Bulk & Tone: normal, no fasciculations. Motor: 5/5 throughout with no pronator drift. Sensation: intact to light touch, cold, pin, vibration and joint position sense.  No extinction to double simultaneous stimulation.  Romberg test negative Deep Tendon Reflexes: +2 throughout, except for absent ankle jerks bilaterally. no ankle clonus Plantar responses: downgoing bilaterally Cerebellar: no incoordination on finger to nose, heel to shin. No dysdiadochokinesia Gait: narrow-based and steady, able to tandem walk adequately. Tremor: none  IMPRESSION: This is a 58 year old right-handed woman with a history of seizures since childhood with episodes of staring and unresponsiveness occurring 2-3 times daily, and infrequent generalized convulsions, none in at least 20 years.  She is presenting for worsening memory.  Her MMSE today is 26/30, however concern is for the high frequency of seizures and possibly subclinical seizures causing the cognitive issues.  She is on Depakote and Tegretol, most recent levels are on the higher side of therapeutic levels, however these may be not be true trough levels.  A routine EEG will be done today, if abnormal, consideration for adding a third AED such as Lamotrigine to help with mood stabilization, or zonisamide for headaches, will be done. Depakote DR will be switched to ER because she has been taking the medication once a day to help her remember to take it.  Continue Depakote ER 500mg  2 tabs in AM, Tegretol XR 400mg  2 tabs qhs.  She does not drive and understands [place] driving laws.  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  She will follow-up in 2 months.  Thank you for allowing me to participate in the care of this patient. Please do not hesitate to call for any questions or concerns.   Amanda Roach, M.D.  CC: Amanda Manning, NP

## 2014-04-23 ENCOUNTER — Ambulatory Visit: Payer: Self-pay | Attending: Internal Medicine

## 2014-04-24 ENCOUNTER — Telehealth: Payer: Self-pay | Admitting: Neurology

## 2014-04-24 MED ORDER — LAMOTRIGINE ER 25 MG PO TB24: ORAL_TABLET | ORAL | Status: DC

## 2014-04-24 NOTE — Procedures (Signed)
ELECTROENCEPHALOGRAM REPORT  Date of Study: 04/18/2014  Patient's Name: Amanda Roach MRN: [id number] Date of Birth: July 22, 1956  Referring Provider: Dr. Ellouise Newer  Clinical History:This is a 59 year old woman with a history of seizures since childhood with episodes of staring and unresponsiveness occurring 2-3 times daily, and infrequent generalized convulsions presenting for worsening memory. EEG to assess for nonconvulsive seizures.  Medications: Depakote, Tegretol  Technical Summary: A multichannel digital 1-hour EEG recording measured by the international 10-20 system with electrodes applied with paste and impedances below 5000 ohms performed in our laboratory with EKG monitoring in an awake and asleep patient.  Hyperventilation and photic stimulation were performed.  The digital EEG was referentially recorded, reformatted, and digitally filtered in a variety of bipolar and referential montages for optimal display.  Spike detection software was employed.  Description: The patient is awake and asleep during the recording.  During maximal wakefulness, there is a symmetric, medium voltage 8-8.5 Hz posterior dominant rhythm that attenuates with eye opening.  There is occasional focal 5 Hz theta slowing seen over the left posterior temporal region.  During drowsiness and sleep, there is an increase in theta slowing of the background.  Vertex waves and symmetric sleep spindles were seen. With drowsiness and sleep, there are frequent bursts of generalized high voltage irregular 4 Hz spike and wave discharges with frontal predominance seen lasting 1-4 seconds.   Hyperventilation and photic stimulation did not elicit any abnormalities.  There were no electrographic seizures seen.    EKG lead was unremarkable.  Impression: This 1-hour awake and asleep EEG is abnormal due to the presence of frequent bursts of generalized high voltage irregular 4 Hz spike and wave discharges with frontal  predominance lasting 1-4 seconds predominantly in drowsiness and sleep.  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  Focal slowing over the left posterior temporal region is suggestive of focal cerebral dysfunction suggestive of underlying structural or physiologic abnormality. There were no prolonged discharges noted during wakefulness.   Ellouise Newer, M.D.

## 2014-04-24 NOTE — Progress Notes (Signed)
Patient came in for EEG. See Procedure notes for EEG results.  

## 2014-04-24 NOTE — Telephone Encounter (Signed)
Discussed EEG findings with patient showing frequent generalized discharges, potentially if prolonged she could have memory issues. In addition, she reports 2-3 seizures daily.  Would add Lamictal to current AEDs.  Discussed side effects, including Kathreen Cosier syndrome.  She will start on low dose at slowly uptitrate.  Start Lamictal ER 25mg  1 tab every other day for 2 weeks, then 1 tab daily for 2 weeks, then 2 tabs daily and continue.

## 2014-05-02 ENCOUNTER — Ambulatory Visit: Payer: Self-pay | Attending: Internal Medicine

## 2014-05-02 DIAGNOSIS — Z0000 Encounter for general adult medical examination without abnormal findings: Secondary | ICD-10-CM

## 2014-06-19 ENCOUNTER — Encounter: Payer: Self-pay | Admitting: Neurology

## 2014-06-19 ENCOUNTER — Ambulatory Visit (INDEPENDENT_AMBULATORY_CARE_PROVIDER_SITE_OTHER): Payer: Self-pay | Admitting: Neurology

## 2014-06-19 VITALS — BP 118/70 | HR 91 | Ht 62.0 in | Wt 220.3 lb

## 2014-06-19 DIAGNOSIS — R569 Unspecified convulsions: Secondary | ICD-10-CM

## 2014-06-19 DIAGNOSIS — R413 Other amnesia: Secondary | ICD-10-CM

## 2014-06-19 NOTE — Patient Instructions (Signed)
1. Schedule 48-hour EEG 2. Take Lamictal ER 25mg  every 2 days for 1 week, then increase to 1 tablet every other day for 2 weeks, then increase to 1 tablet daily 3. Follow-up in 6 weeks

## 2014-06-19 NOTE — Progress Notes (Signed)
NEUROLOGY FOLLOW UP OFFICE NOTE  Amanda Roach [id number]  HISTORY OF PRESENT ILLNESS: I had the pleasure of seeing Amanda Roach in follow-up in the neurology clinic on 06/19/2014.  The patient was last seen 2 months ago for seizures and memory loss.  She had been taking Depakote and Tegretol XR.  Her routine EEG was abnormal with occasional focal slowing over the left posterior temporal region, as well as frequent bursts of generalized high voltage irregular 4 Hz spike and wave discharges with frontal predominance lasting 1-4 seconds predominantly in drowsiness and sleep, consistent with a primary generalized epilepsy.  She had started Lamictal XR 25mg  every other day 2 weeks ago, and tells me that since starting it, she feels that she has been having more seizures.  She reports having a smaller one in the waiting room today, with head twitching to the right lasting 3-4 seconds.  She has had several this morning already.  She had 2 big ones since her last visit, where she reports that last week her right arm twitched, and yesterday she bit her tongue.  She has been staying with her sister but is unhappy with this living situation.  She continues to follow-up with her psychiatrist and therapist for the depression. She reports her mood is not good.  She feels her vision has gotten worse, with blurriness. She has not seen her eye doctor.    HPI:  This is a 58 yo RH woman with a history of hypertension, depression, and seizures since age 27 or 40, presenting for worsening memory and continued seizures. She and her sister report seizures started at age 37 or 26, she has "petit" ones and generalized convulsions. The "petit" seizures consist of staring and unresponsiveness with slight shaking of the head lasting 1 minutes, sometimes occurring in clusters of 3 ro 4 within a 3 hour interval. They deny any lip smacking, dystonic posturing, or automatisms. She has had urinary incontinence and some tongue bite with  these. She knows when she is having one, she feels her head turning to the right but cannot control it, loses focus, forgetting what she was doing, followed by generalized weakness. She lives alone and reports having the petit ones 2-3 times daily. She denies any generalized convulsions in at least 20 years. Her sister describes right head version with some of the convulsions.   She has been taking Depakote DR 500mg  2 tabs in AM and Tegretol XR 400mg  2 tabs qhs. She recalls trying Dilantin and Phenobarbital when younger.   She denies any gustatory hallucinations, deja vu, rising epigastric sensation, focal numbness/tingling/weakness. She has had body jerks since childhood. She has told her sister she has smelled "poop" several times.   She and her sister report that she has always had memory problems as a child, but this has greatly increased in the past 20 years. She would say something that didn't happen, or forget things she was supposed to do. Her sister is concerned about how she had given her phone number to strangers and could not recall if she had given them the wrong number. She lives alone but family has stayed in contact with her daily. She would get paranoid if they do not call her. She has been working with her therapist on this, and has a schedule book. She reports that her mood is really good some days, but other days "it's just no good," she sits and is "totally depressed." She can get very angry and violent at  times. She denies any suicidal ideation. She has been taking Seroquel for the past 3 months.   She has frequent headaches over the bitemporal and frontal region, with throbbing associated with dizziness, blurred vision, nausea and photophobia. Headaches started in childhood, she reports having 4 headaches a month and does not take any medication. Triggers include loud noises and blinking lights. She has occasional tingling in both feet. She denies any diplopia, dysarthria, dysphagia,  neck/back pain, bowel/bladder dysfunction. She had a college degree in music. She is not driving.   Epilepsy Risk Factors: Her father and 2 sisters had seizures in childhood. Otherwise she had a normal birth and early development. There is no history of febrile convulsions, CNS infections such as meningitis/encephalitis, significant traumatic brain injury, neurosurgical procedures  PAST MEDICAL HISTORY: Past Medical History  Diagnosis Date  . Seizure   . Hypertension   . Knee pain   . Asthma     MEDICATIONS: Current Outpatient Prescriptions on File Prior to Visit  Medication Sig Dispense Refill  . acetaminophen-codeine (TYLENOL #3) 300-30 MG per tablet Take 1 tablet by mouth every 4 (four) hours as needed.  60 tablet  0  . carbamazepine (TEGRETOL XR) 400 MG 12 hr tablet Take 2 tabs daily  60 tablet  11  . divalproex (DEPAKOTE ER) 500 MG 24 hr tablet Take 2 tabs daily  60 tablet  11  . esomeprazole (NEXIUM) 40 MG capsule Take 1 capsule (40 mg total) by mouth daily before breakfast.  30 capsule  0  . LamoTRIgine (LAMICTAL XR) 25 MG TB24 tablet Take 1 tablet every other day for 2 weeks, then increase to 1 tablet daily for 2 weeks, then increase to 2 tablets daily and continue  60 tablet  3  . olmesartan-hydrochlorothiazide (BENICAR HCT) 20-12.5 MG per tablet Take 1 tablet by mouth daily.  30 tablet  0  . QUEtiapine (SEROQUEL) 400 MG tablet Take 400 mg by mouth at bedtime.       No current facility-administered medications on file prior to visit.    ALLERGIES: No Known Allergies  FAMILY HISTORY: History reviewed. No pertinent family history.  SOCIAL HISTORY: History   Social History  . Marital Status: Divorced    Spouse Name: N/A    Number of Children: N/A  . Years of Education: N/A   Occupational History  . Not on file.   Social History Main Topics  . Smoking status: Never Smoker   . Smokeless tobacco: Not on file  . Alcohol Use: No  . Drug Use: No  . Sexual Activity:  Not on file   Other Topics Concern  . Not on file   Social History Narrative  . No narrative on file    REVIEW OF SYSTEMS: Constitutional: No fevers, chills, or sweats, no generalized fatigue, change in appetite Eyes: No visual changes, double vision, eye pain Ear, nose and throat: No hearing loss, ear pain, nasal congestion, sore throat Cardiovascular: No chest pain, palpitations Respiratory:  No shortness of breath at rest or with exertion, wheezes GastrointestinaI: No nausea, vomiting, diarrhea, abdominal pain, fecal incontinence Genitourinary:  No dysuria, urinary retention or frequency Musculoskeletal:  No neck pain, back pain Integumentary: No rash, pruritus, skin lesions Neurological: as above Psychiatric: + depression, insomnia, anxiety Endocrine: No palpitations, fatigue, diaphoresis, mood swings, change in appetite, change in weight, increased thirst Hematologic/Lymphatic:  No anemia, purpura, petechiae. Allergic/Immunologic: no itchy/runny eyes, nasal congestion, recent allergic reactions, rashes  PHYSICAL EXAM: Filed Vitals:   06/19/14  1252  BP: 118/70  Pulse: 91   General: No acute distress, flat affect Head:  Normocephalic/atraumatic Neck: supple, no paraspinal tenderness, full range of motion Heart:  Regular rate and rhythm Lungs:  Clear to auscultation bilaterally Back: No paraspinal tenderness Skin/Extremities: No rash, no edema Neurological Exam: alert and oriented to person, place, and time. No aphasia or dysarthria. Fund of knowledge is appropriate.  Recent and remote memory are intact.  Attention and concentration are normal.    Able to name objects and repeat phrases. Cranial nerves: Pupils equal, round, reactive to light.  Fundoscopic exam unremarkable, no papilledema. Extraocular movements intact with no nystagmus. Visual fields full. Facial sensation intact. No facial asymmetry. Tongue, uvula, palate midline.  Motor: Bulk and tone normal, muscle strength  5/5 throughout with no pronator drift.  Sensation to light touch.  No extinction to double simultaneous stimulation.  Deep tendon reflexes 2+ throughout except for absent ankle jerks bilaterally, toes downgoing.  Finger to nose testing intact.  Gait narrow-based and steady, able to tandem walk adequately.  Romberg negative.  IMPRESSION: This is a 58 yo RH woman with a history of seizures since childhood with episodes of staring and unresponsiveness occurring 2-3 times daily, and infrequent generalized convulsions, none in at least 20 years, who presented for worsening memory.  Her routine EEG was consistent with primary generalized epilepsy, no subclinical seizures seen on 1-hour study.  She continues to report multiple daily petit seizures.  She feels seizures have increased with starting Lamictal.  She will do a slower initiation schedule, I have encouraged her to continue to with course as this may help with mood as well.  She will be scheduled for a 48-hour EEG to further classify her multiple daily episodes.  Continue Depakote Depakote ER 500mg  2 tabs in AM, Tegretol XR 400mg  2 tabs qhs. She does not drive and understands [place] driving laws. Continue to work with her psychiatrist. She will follow-up in 6 weeks.  Thank you for allowing me to participate in her care.  Please do not hesitate to call for any questions or concerns.  The duration of this appointment visit was 15 minutes of face-to-face time with the patient.  Greater than 50% of this time was spent in counseling, explanation of diagnosis, planning of further management, and coordination of care.   Ellouise Newer, M.D.   CC: Dr. Doreene Burke

## 2014-06-21 ENCOUNTER — Encounter: Payer: Self-pay | Admitting: Neurology

## 2014-07-01 ENCOUNTER — Other Ambulatory Visit: Payer: Self-pay | Admitting: *Deleted

## 2014-07-01 ENCOUNTER — Encounter: Payer: Self-pay | Admitting: Internal Medicine

## 2014-07-01 ENCOUNTER — Ambulatory Visit: Payer: Self-pay | Attending: Internal Medicine | Admitting: Internal Medicine

## 2014-07-01 VITALS — BP 134/67 | HR 81 | Temp 98.7°F | Resp 18 | Ht 62.0 in | Wt 219.4 lb

## 2014-07-01 DIAGNOSIS — J45909 Unspecified asthma, uncomplicated: Secondary | ICD-10-CM | POA: Insufficient documentation

## 2014-07-01 DIAGNOSIS — R413 Other amnesia: Secondary | ICD-10-CM | POA: Insufficient documentation

## 2014-07-01 DIAGNOSIS — M25569 Pain in unspecified knee: Secondary | ICD-10-CM | POA: Insufficient documentation

## 2014-07-01 DIAGNOSIS — G40909 Epilepsy, unspecified, not intractable, without status epilepticus: Secondary | ICD-10-CM | POA: Insufficient documentation

## 2014-07-01 DIAGNOSIS — I10 Essential (primary) hypertension: Secondary | ICD-10-CM | POA: Insufficient documentation

## 2014-07-01 LAB — HEMOGLOBIN A1C
HEMOGLOBIN A1C: 5.6 % (ref <5.7)
Mean Plasma Glucose: 114 mg/dL (ref <117)

## 2014-07-01 LAB — LIPID PANEL
CHOLESTEROL: 244 mg/dL — AB (ref 0–200)
HDL: 63 mg/dL (ref >39)
LDL CALC: 149 mg/dL — AB (ref 0–99)
Total CHOL/HDL Ratio: 3.9 Ratio
Triglycerides: 161 mg/dL — ABNORMAL HIGH (ref <150)
VLDL: 32 mg/dL (ref 0–40)

## 2014-07-01 LAB — COMPLETE METABOLIC PANEL WITH GFR
ALT: 15 U/L (ref 0–35)
AST: 17 U/L (ref 0–37)
Albumin: 4.3 g/dL (ref 3.5–5.2)
Alkaline Phosphatase: 81 U/L (ref 39–117)
BILIRUBIN TOTAL: 0.3 mg/dL (ref 0.2–1.2)
BUN: 14 mg/dL (ref 6–23)
CO2: 32 mEq/L (ref 19–32)
CREATININE: 0.91 mg/dL (ref 0.50–1.10)
Calcium: 9.4 mg/dL (ref 8.4–10.5)
Chloride: 100 mEq/L (ref 96–112)
GFR, Est African American: 82 mL/min
GFR, Est Non African American: 71 mL/min
Glucose, Bld: 90 mg/dL (ref 70–99)
Potassium: 4.3 mEq/L (ref 3.5–5.3)
SODIUM: 139 meq/L (ref 135–145)
Total Protein: 7.6 g/dL (ref 6.0–8.3)

## 2014-07-01 LAB — CBC
HCT: 35.8 % — ABNORMAL LOW (ref 36.0–46.0)
HEMOGLOBIN: 12.3 g/dL (ref 12.0–15.0)
MCH: 29.4 pg (ref 26.0–34.0)
MCHC: 34.4 g/dL (ref 30.0–36.0)
MCV: 85.6 fL (ref 78.0–100.0)
Platelets: 283 10*3/uL (ref 150–400)
RBC: 4.18 MIL/uL (ref 3.87–5.11)
RDW: 14.2 % (ref 11.5–15.5)
WBC: 4.3 10*3/uL (ref 4.0–10.5)

## 2014-07-01 LAB — TSH: TSH: 1.693 u[IU]/mL (ref 0.350–4.500)

## 2014-07-01 MED ORDER — OLMESARTAN MEDOXOMIL-HCTZ 20-12.5 MG PO TABS: 1.0000 | ORAL_TABLET | Freq: Every day | ORAL | Status: DC

## 2014-07-01 NOTE — Progress Notes (Signed)
Patient ID: Amanda Roach, female   DOB: November 26, 1955, 58 y.o.   MRN: [id number]  CC: HTN  HPI:  Patient presents today for a follow up of hypertension.  Patient reports that she has been out of her Benicar for two days because the health department never sent a medication refill request.  She has a history of seizure disorder and memory loss.  She presents with a family member today that has been helping to manage her care.  Her family helps her to make a schedule that keeps track of her seizures, medications, and medical appointments.  She states that they have a system to make sure she had plenty of reminders of things she commonly forgets.  Patient reports that since last visit she has been to see [facility] and they have attributed her memory loss to her seizure disorder and she has began a Lamictal taper to help.     No Known Allergies Past Medical History  Diagnosis Date  . Seizure   . Hypertension   . Knee pain   . Asthma    Current Outpatient Prescriptions on File Prior to Visit  Medication Sig Dispense Refill  . acetaminophen-codeine (TYLENOL #3) 300-30 MG per tablet Take 1 tablet by mouth every 4 (four) hours as needed.  60 tablet  0  . carbamazepine (TEGRETOL XR) 400 MG 12 hr tablet Take 2 tabs daily  60 tablet  11  . divalproex (DEPAKOTE ER) 500 MG 24 hr tablet Take 2 tabs daily  60 tablet  11  . esomeprazole (NEXIUM) 40 MG capsule Take 1 capsule (40 mg total) by mouth daily before breakfast.  30 capsule  0  . LamoTRIgine (LAMICTAL XR) 25 MG TB24 tablet Take 1 tablet every other day for 2 weeks, then increase to 1 tablet daily for 2 weeks, then increase to 2 tablets daily and continue  60 tablet  3  . olmesartan-hydrochlorothiazide (BENICAR HCT) 20-12.5 MG per tablet Take 1 tablet by mouth daily.  30 tablet  0  . QUEtiapine (SEROQUEL) 400 MG tablet Take 400 mg by mouth at bedtime.      Marland Kitchen lisinopril-hydrochlorothiazide (PRINZIDE,ZESTORETIC) 10-12.5 MG per tablet TAKE ONE  TABLET BY MOUTH EVERY DAY       No current facility-administered medications on file prior to visit.   History reviewed. No pertinent family history. History   Social History  . Marital Status: Divorced    Spouse Name: N/A    Number of Children: N/A  . Years of Education: N/A   Occupational History  . Not on file.   Social History Main Topics  . Smoking status: Never Smoker   . Smokeless tobacco: Not on file  . Alcohol Use: No  . Drug Use: No  . Sexual Activity: Not on file   Other Topics Concern  . Not on file   Social History Narrative  . No narrative on file   Review of Systems  Respiratory: Negative.   Cardiovascular: Negative.   Musculoskeletal:       Right knee pain from fall back in January  Neurological: Positive for seizures. Negative for dizziness and tingling.  Psychiatric/Behavioral: Positive for memory loss. Negative for depression, suicidal ideas, hallucinations and substance abuse. The patient is not nervous/anxious.       Objective:   Filed Vitals:   07/01/14 1220  BP: 134/67  Pulse: 81  Temp: 98.7 F (37.1 C)  Resp: 18   Physical Exam  Constitutional: She is oriented to person,  place, and time.  Cardiovascular: Normal rate, regular rhythm and normal heart sounds.   Pulmonary/Chest: Effort normal and breath sounds normal.  Abdominal: Soft. Bowel sounds are normal.  Musculoskeletal: Normal range of motion. She exhibits no edema and no tenderness.  Neurological: She is alert and oriented to person, place, and time. She has normal reflexes.  Psychiatric:  Flat   '  Lab Results  Component Value Date   WBC 3.7* 04/23/2013   HGB 13.5 04/23/2013   HCT 39.6 04/23/2013   MCV 87.4 04/23/2013   PLT 210 04/23/2013   Lab Results  Component Value Date   CREATININE 0.98 04/23/2013   BUN 7 04/23/2013   NA 142 04/23/2013   K 3.9 04/23/2013   CL 103 04/23/2013   CO2 30 04/23/2013    No results found for this basename: HGBA1C   Lipid Panel  No  results found for this basename: chol, trig, hdl, cholhdl, vldl, ldlcalc       Assessment and plan:   Charlott was seen today for hypertension.  Diagnoses and associated orders for this visit:  Essential hypertension - olmesartan-hydrochlorothiazide (BENICAR HCT) 20-12.5 MG per tablet; Take 1 tablet by mouth daily. - COMPLETE METABOLIC PANEL WITH GFR - CBC - Lipid panel - TSH - Hemoglobin A1C Continue DASH diet and weight loss Seizure disorder Continue medications and f/u with neurolgy Memory loss Continue to keep schedule and medical information current.   Return in about 3 months (around 10/01/2014) for Hypertension.        Chari Manning, NP-C [facility] [phone] 07/01/2014, 12:36 PM

## 2014-07-01 NOTE — Progress Notes (Signed)
F/U HBP

## 2014-07-02 MED ORDER — ESOMEPRAZOLE MAGNESIUM 40 MG PO CPDR: 40.0000 mg | DELAYED_RELEASE_CAPSULE | Freq: Every day | ORAL | Status: DC

## 2014-08-05 ENCOUNTER — Telehealth: Payer: Self-pay | Admitting: *Deleted

## 2014-08-05 NOTE — Telephone Encounter (Signed)
Message copied by Joan Mayans on Mon Aug 05, 2014  2:52 PM ------      Message from: Chari Manning A      Created: Tue Jul 02, 2014 12:53 PM       Please let patient know her cholesterol was elevated and I would like to place her on atorvastatin 10 mg. ------

## 2014-08-05 NOTE — Telephone Encounter (Signed)
Left message for pt to return my call.

## 2014-08-06 ENCOUNTER — Telehealth: Payer: Self-pay | Admitting: Internal Medicine

## 2014-08-06 ENCOUNTER — Other Ambulatory Visit: Payer: Self-pay | Admitting: *Deleted

## 2014-08-06 MED ORDER — ATORVASTATIN CALCIUM 10 MG PO TABS: 10.0000 mg | ORAL_TABLET | Freq: Every day | ORAL | Status: DC

## 2014-08-06 NOTE — Telephone Encounter (Signed)
Milah from [facility] doesn't have Atorvastatin 10 mg. They request authorization to change it for Crestor 5mg  or Sivastatin 20 mg or Pravastatin 40 mg.  Please send authorization to fax # [phone]

## 2014-08-06 NOTE — Progress Notes (Signed)
I spoke to the pt and informed her of her results and sent the rx to her pharmacy.

## 2014-08-13 ENCOUNTER — Telehealth: Payer: Self-pay

## 2014-08-13 MED ORDER — ATORVASTATIN CALCIUM 10 MG PO TABS: 10.0000 mg | ORAL_TABLET | Freq: Every day | ORAL | Status: DC

## 2014-08-26 ENCOUNTER — Ambulatory Visit (INDEPENDENT_AMBULATORY_CARE_PROVIDER_SITE_OTHER): Payer: Self-pay | Admitting: Neurology

## 2014-08-26 DIAGNOSIS — R569 Unspecified convulsions: Secondary | ICD-10-CM

## 2014-08-26 DIAGNOSIS — R413 Other amnesia: Secondary | ICD-10-CM

## 2014-09-02 ENCOUNTER — Encounter: Payer: Self-pay | Admitting: Neurology

## 2014-09-02 ENCOUNTER — Ambulatory Visit (INDEPENDENT_AMBULATORY_CARE_PROVIDER_SITE_OTHER): Payer: Self-pay | Admitting: Neurology

## 2014-09-02 VITALS — BP 122/76 | HR 84 | Resp 18 | Ht 62.0 in | Wt 225.0 lb

## 2014-09-02 DIAGNOSIS — G40319 Generalized idiopathic epilepsy and epileptic syndromes, intractable, without status epilepticus: Secondary | ICD-10-CM | POA: Insufficient documentation

## 2014-09-02 NOTE — Progress Notes (Signed)
NEUROLOGY FOLLOW UP OFFICE NOTE  Amanda Roach [id number]  HISTORY OF PRESENT ILLNESS: I had the pleasure of seeing Amanda Roach in follow-up in the neurology clinic on 09/02/2014.  The patient was last seen 2 months ago for seizures and memory loss.  She had been taking Depakote ER 1000mg /day and Tegretol XR 800mg /day and reported increased seizures on low dose Lamotrigine ER 25mg . We agreed to do a very slow uptitration by taking it every other day for several weeks, she is now taking it once a day and feels that is it helping. She reports that in the past, the seizures were occurring intermittently throughout the day, she has noticed that these are less. She describes the "petit" as head twitching to the right, and had one this morning. She feels that she may be having more seizures at night, she has woken up with a tongue bite. I personally reviewed her 48-hour EEG to assess for subclinical seizures. Ambulatory EEG findings were similar to her routine EEG, with occasional focal slowing over the left temporal region, and frequent generalized 4-5 Hz spike and polyspike and wave discharges lasting 1-5 seconds. There was shifting lead-in noted over the left and right frontal regions, raising the possibility of secondary bisynchrony.  She did not write her seizures down in the diary, but reports she had a lot. It is still unclear if the epileptiform discharges lasting more than 3 seconds are symptomatic as she did not write down the times of her seizures.  There were no prolonged discharges lasting more than 10 seconds seen.   She and her sister do note that her depression is much better. Her personality has greatly changed for the better, which her psychiatrist and therapist have noticed as well. She is communicating a lot better, she reports this is the first time she is talking to me better. In the past she would have difficulty understanding what was going on and would get angry and frustrated easily.  She has noticed this as well and reports she is calmer. She continues to have memory problems, forgetting what she had said 5 minutes prior. She denies any headaches, dizziness, diplopia, focal numbness/tingling/weakness. She feels more shaky.  HPI: This is a 58 yo RH woman with a history of hypertension, depression, and seizures since age 24 or 66, presenting for worsening memory and continued seizures. She and her sister report seizures started at age 15 or 83, she has "petit" ones and generalized convulsions. The "petit" seizures consist of staring and unresponsiveness with slight shaking of the head lasting 1 minutes, sometimes occurring in clusters of 3 ro 4 within a 3 hour interval. They deny any lip smacking, dystonic posturing, or automatisms. She has had urinary incontinence and some tongue bite with these. She knows when she is having one, she feels her head turning to the right but cannot control it, loses focus, forgetting what she was doing, followed by generalized weakness. She lives alone and reports having the petit ones 2-3 times daily. She denies any generalized convulsions in at least 20 years. Her sister describes right head version with some of the convulsions.  She had been taking Depakote DR 500mg  2 tabs in AM and Tegretol XR 400mg  2 tabs qhs. She recalls trying Dilantin and Phenobarbital when younger.  She denies any gustatory hallucinations, deja vu, rising epigastric sensation, focal numbness/tingling/weakness. She has had body jerks since childhood. She has told her sister she has smelled "poop" several times.   She and  her sister report that she has always had memory problems as a child, but this has greatly increased in the past 20 years. She would say something that didn't happen, or forget things she was supposed to do. Her sister is concerned about how she had given her phone number to strangers and could not recall if she had given them the wrong number. She lives alone but family  has stayed in contact with her daily. She would get paranoid if they do not call her. She has been working with her therapist on this, and has a schedule book. She reports that her mood is really good some days, but other days "it's just no good," she sits and is "totally depressed." She can get very angry and violent at times. She denies any suicidal ideation. She takes Seroquel at bedtime.  Epilepsy Risk Factors: Her father and 2 sisters had seizures in childhood. Otherwise she had a normal birth and early development. There is no history of febrile convulsions, CNS infections such as meningitis/encephalitis, significant traumatic brain injury, neurosurgical procedures  Diagnostic Data: Routine EEG was abnormal with occasional focal slowing over the left posterior temporal region, as well as frequent bursts of generalized high voltage irregular 4 Hz spike and wave discharges with frontal predominance lasting 1-4 seconds predominantly in drowsiness and sleep, consistent with a primary generalized epilepsy.   PAST MEDICAL HISTORY: Past Medical History  Diagnosis Date  . Seizure   . Hypertension   . Knee pain   . Asthma     MEDICATIONS: Current Outpatient Prescriptions on File Prior to Visit  Medication Sig Dispense Refill  . acetaminophen-codeine (TYLENOL #3) 300-30 MG per tablet Take 1 tablet by mouth every 4 (four) hours as needed.  60 tablet  0  . atorvastatin (LIPITOR) 10 MG tablet Take 1 tablet (10 mg total) by mouth daily.  90 tablet  3  . carbamazepine (TEGRETOL XR) 400 MG 12 hr tablet Take 2 tabs daily  60 tablet  11  . divalproex (DEPAKOTE ER) 500 MG 24 hr tablet Take 2 tabs daily  60 tablet  11  . esomeprazole (NEXIUM) 40 MG capsule Take 1 capsule (40 mg total) by mouth daily before breakfast.  30 capsule  0  . LamoTRIgine (LAMICTAL XR) 25 MG TB24 tablet Take 1 tablet every other day for 2 weeks, then increase to 1 tablet daily for 2 weeks, then increase to 2 tablets daily and  continue  60 tablet  3  . olmesartan-hydrochlorothiazide (BENICAR HCT) 20-12.5 MG per tablet Take 1 tablet by mouth daily.  30 tablet  4  . QUEtiapine (SEROQUEL) 400 MG tablet Take 400 mg by mouth at bedtime.       No current facility-administered medications on file prior to visit.    ALLERGIES: No Known Allergies  FAMILY HISTORY: No family history on file.  SOCIAL HISTORY: History   Social History  . Marital Status: Divorced    Spouse Name: N/A    Number of Children: N/A  . Years of Education: N/A   Occupational History  . Not on file.   Social History Main Topics  . Smoking status: Never Smoker   . Smokeless tobacco: Not on file  . Alcohol Use: No  . Drug Use: No  . Sexual Activity: Not on file   Other Topics Concern  . Not on file   Social History Narrative  . No narrative on file    REVIEW OF SYSTEMS: Constitutional: No fevers, chills, or  sweats, no generalized fatigue, change in appetite Eyes: No visual changes, double vision, eye pain Ear, nose and throat: No hearing loss, ear pain, nasal congestion, sore throat Cardiovascular: No chest pain, palpitations Respiratory:  +occasional shortness of breath with exertion, no wheezes GastrointestinaI: No nausea, vomiting, diarrhea, abdominal pain, fecal incontinence Genitourinary:  No dysuria, urinary retention or frequency Musculoskeletal:  No neck pain, back pain Integumentary: No rash, pruritus, skin lesions Neurological: as above Psychiatric: No depression, insomnia, anxiety Endocrine: No palpitations, fatigue, diaphoresis, mood swings, change in appetite, change in weight, increased thirst Hematologic/Lymphatic:  No anemia, purpura, petechiae. Allergic/Immunologic: no itchy/runny eyes, nasal congestion, recent allergic reactions, rashes  PHYSICAL EXAM: Filed Vitals:   09/02/14 1007  BP: 122/76  Pulse: 84  Resp: 18   General: No acute distress, more upbeat today with better eye contact Head:   Normocephalic/atraumatic Neck: supple, no paraspinal tenderness, full range of motion Heart:  Regular rate and rhythm Lungs:  Clear to auscultation bilaterally Back: No paraspinal tenderness Skin/Extremities: No rash, no edema Neurological Exam: alert and oriented to person, place, and time. No aphasia or dysarthria. Fund of knowledge is appropriate.  Recent and remote memory are intact.  Attention and concentration are normal.    Able to name objects and repeat phrases. Cranial nerves: Pupils equal, round, reactive to light.  Fundoscopic exam unremarkable, no papilledema. Extraocular movements intact with no nystagmus. Visual fields full. Facial sensation intact. No facial asymmetry. Tongue, uvula, palate midline.  Motor: Bulk and tone normal, muscle strength 5/5 throughout with no pronator drift.  Sensation to light touch intact.  No extinction to double simultaneous stimulation.  Deep tendon reflexes 2+ throughout, toes downgoing.  Finger to nose testing intact.  Gait narrow-based and steady, able to tandem walk adequately.  Romberg negative. Tremor: no resting, postural, or action tremor noted today.  IMPRESSION: This is a 58 yo RH woman with a history of seizures since childhood with episodes of staring and unresponsiveness with head twitch to the right, and infrequent generalized convulsions, none in at least 20 years. She had presented with worsening memory. Her routine EEG was consistent with primary generalized epilepsy, similar findings on 48-hour EEG with no subclinical seizures (discharges lasting more than 10 seconds) seen, however there were frequent 1-5 second bursts, unclear if patient is symptomatic as she did not fill out diary despite reporting she had seizures during the ambulatory EEG.  She continues to report multiple daily petit seizures but has noticed improvement with addition of Lamictal. She will increase Lamotrigine ER to 50mg /day. Continue Depakote ER 1000mg /day and Tegretol XR  800mg /day. She reports feeling shaky, check Depakote and Lamictal levels. She asks about the "spot in her brain," no prior imaging available, MRI brain with and without contrast will be ordered as part of seizure workup, her EEG had shown left and right-sided lead-in, raising the possibility of secondary bisynchrony.  Continue follow-up with psychiatry. She does not drive and understands [place] driving laws. Continue to work with her psychiatrist. She will follow-up in 2 months.  Thank you for allowing me to participate in her care.  Please do not hesitate to call for any questions or concerns.  The duration of this appointment visit was 15 minutes of face-to-face time with the patient.  Greater than 50% of this time was spent in counseling, explanation of diagnosis, planning of further management, and coordination of care.   Ellouise Newer, M.D.   CC: Debbora Dus (psychiatrist) [phone]

## 2014-09-02 NOTE — Patient Instructions (Addendum)
1. Schedule MRI brain with and without contrast 2. Bloodwork for Depakote and Lamictal level 3. Increase Lamotrigine ER 25mg : Take 2 tablets daily 4. Continue Depakote ER 500mg : Take 2 tablets in morning 5. Continue Tegretol XR 400mg : Take 2 tablets at bedtime 6. Follow-up in 2 months

## 2014-09-04 LAB — CREATININE, SERUM: Creat: 1.05 mg/dL (ref 0.50–1.10)

## 2014-09-04 LAB — VALPROIC ACID LEVEL: Valproic Acid Lvl: 80.7 ug/mL (ref 50.0–100.0)

## 2014-09-04 LAB — BUN: BUN: 20 mg/dL (ref 6–23)

## 2014-09-06 NOTE — Procedures (Signed)
ELECTROENCEPHALOGRAM REPORT  Dates of Recording: 08/26/2014 to 08/28/2014  Patient's Name: CAMIYAH FRIBERG MRN: [id number] Date of Birth: [birth year]  Referring Provider: Dr. Ellouise Newer  Procedure: 48-hour ambulatory EEG  History: This is a 58 year old  woman with a history of seizures since childhood with episodes of staring and unresponsiveness and infrequent generalized convulsions. She reports worsening memory and continued multiple daily petit seizures.  She feels seizures have increased with starting Lamictal. EEG for classification and assess for subclinical seizures.  Medications: Tegretol, Depakote, Lamictal, Seroquel  Technical Summary: This is a 48-hour multichannel digital EEG recording measured by the international 10-20 system with electrodes applied with paste and impedances below 5000 ohms performed as portable with EKG monitoring.  The digital EEG was referentially recorded, reformatted, and digitally filtered in a variety of bipolar and referential montages for optimal display.    DESCRIPTION OF RECORDING: During maximal wakefulness, the background activity consisted of a symmetric 10 Hz posterior dominant rhythm which was reactive to eye opening. There is occasional focal 4-5 Hz theta slowing seen over the left temporal region, at times sharply contoured without clear epileptogenic potential. There were occasional bursts of generalized high voltage regular 4-5 Hz spike and wave discharges with frontal predominance lasting 1- 5 seconds.  During the recording, the patient progresses through wakefulness, drowsiness, and Stage 2 sleep. Similar occasional focal slowing is seen over the left temporal region. There is an increase in frequency of bursts of generalized 4-5 Hz spike and polyspike and wave discharges seen in drowsiness and sleep, lasting 1- 5 seconds, at times with shifting lead-in over the bilateral frontal regions. Fragments of these discharges are seen independently  over the left and right frontal regions.  Events: There were no push button events. Patient reported dizziness on 10/19 at 2200 hours, no EEG changes seen. She reported multiple seizures but did not write down symptoms on the diary.  There were no prolonged discharges lasting 10 seconds or more seen.  EKG lead was unremarkable.  IMPRESSION: This 48-hour ambulatory EEG study is abnormal due to the presence of: 1. Occasional focal slowing over the left temporal region 2. Bursts of generalized high voltage 4-5 Hz spike and polyspike and wave discharges lasting 1- 5 seconds that increase in frequency in drowsiness and sleep, at times with shifting lead-in over the bilateral frontal regions  CLINICAL CORRELATION of the above findings indicates focal cerebral dysfunction over the left temporal region suggestive of underlying structural or physiologic abnormality. The generalized spike and polyspike and wave discharges are consistent with a primary generalized epilepsy, however note of shifting lead-in over the bilateral frontal regions may also be seen with secondary bisynchrony. There were no prolonged discharges seen in this study, however patient reported multiple petit seizures during the study, unclear if correlated to discharges lasting more than 3 seconds as she did not note them on diary.    Ellouise Newer, M.D.

## 2014-09-07 LAB — LAMOTRIGINE LEVEL: Lamotrigine Lvl: 1.2 ug/mL — ABNORMAL LOW (ref 4.0–18.0)

## 2014-09-16 ENCOUNTER — Ambulatory Visit (HOSPITAL_COMMUNITY)
Admission: RE | Admit: 2014-09-16 | Discharge: 2014-09-16 | Disposition: A | Discharge status: Home / Self Care | Payer: Self-pay | Source: Ambulatory Visit | Attending: Neurology | Admitting: Neurology

## 2014-09-16 ENCOUNTER — Telehealth: Payer: Self-pay | Admitting: Family Medicine

## 2014-09-16 DIAGNOSIS — M479 Spondylosis, unspecified: Secondary | ICD-10-CM | POA: Insufficient documentation

## 2014-09-16 DIAGNOSIS — G319 Degenerative disease of nervous system, unspecified: Secondary | ICD-10-CM | POA: Insufficient documentation

## 2014-09-16 MED ORDER — GADOBENATE DIMEGLUMINE 529 MG/ML IV SOLN
20.0000 mL | Freq: Once | INTRAVENOUS | Status: AC | PRN
Start: 2014-09-16 — End: 2014-09-16
  Administered 2014-09-16: 20 mL via INTRAVENOUS

## 2014-09-16 NOTE — Telephone Encounter (Signed)
Records release form received from [place]. Release form sent to HIM department on 09/16/14.

## 2014-09-26 ENCOUNTER — Telehealth: Payer: Self-pay | Admitting: Emergency Medicine

## 2014-09-26 ENCOUNTER — Other Ambulatory Visit: Payer: Self-pay | Admitting: Emergency Medicine

## 2014-09-26 MED ORDER — ESOMEPRAZOLE MAGNESIUM 40 MG PO CPDR: 40.0000 mg | DELAYED_RELEASE_CAPSULE | Freq: Every day | ORAL | Status: DC

## 2014-09-26 NOTE — Telephone Encounter (Signed)
Amanda Roach- do you want pt to continue on Lamictal? Before I refill TY

## 2014-09-29 NOTE — Telephone Encounter (Signed)
I though neurology was handling her seizure medications. If she is unable to get a hold of neurologist you may refill

## 2014-09-30 ENCOUNTER — Ambulatory Visit: Payer: No Typology Code available for payment source | Attending: Internal Medicine | Admitting: Internal Medicine

## 2014-09-30 VITALS — BP 141/92 | HR 77 | Temp 98.4°F

## 2014-09-30 DIAGNOSIS — J040 Acute laryngitis: Secondary | ICD-10-CM | POA: Insufficient documentation

## 2014-09-30 DIAGNOSIS — R5383 Other fatigue: Secondary | ICD-10-CM | POA: Insufficient documentation

## 2014-09-30 DIAGNOSIS — H5713 Ocular pain, bilateral: Secondary | ICD-10-CM | POA: Insufficient documentation

## 2014-09-30 DIAGNOSIS — H109 Unspecified conjunctivitis: Secondary | ICD-10-CM

## 2014-09-30 MED ORDER — OFLOXACIN 0.3 % OP SOLN: 2.0000 [drp] | Freq: Four times a day (QID) | OPHTHALMIC | Status: DC

## 2014-09-30 NOTE — Progress Notes (Signed)
Patient walked in c/o 3 day history of red, swollen, itchy, painful eyes R > L; rates 6/10 at present States she wakes in the morning with eyelids crusted together Also, c/o fatigue, laryngitis, right ear pain and cough occassionally productive of small clear sputum  Patient does not wear contacts  BP 141/92 P 77 T 98.4 oral SPO2 99%  Eyes: left eye clear. Right eye with redness and tan colored drainage at right inner canthus Ears: left ear clear canal without redness or drainage; tympanic membrane intact without redness or drainage. Right ear with red canal; tympanic membrane intact without redness or drainage   Throat: slight redness Lungs: clear to auscultation with good breath sounds noted throughout  P: Ocuflox 0.3% opthalmic solution 2 drops into both eyes qid ordered by provider  Patient educated on warm vs. cool compresses, keeping hands away from eyes, keeping medication tip clean, cleaning surfaces such as door knobs to prevent spreading infection.  Information on conjunctivitis provided to patient

## 2014-09-30 NOTE — Patient Instructions (Signed)

## 2014-11-04 ENCOUNTER — Emergency Department (INDEPENDENT_AMBULATORY_CARE_PROVIDER_SITE_OTHER)
Admission: EM | Admit: 2014-11-04 | Discharge: 2014-11-04 | Disposition: A | Discharge status: Home / Self Care | Payer: Self-pay | Source: Home / Self Care | Attending: Emergency Medicine | Admitting: Emergency Medicine

## 2014-11-04 ENCOUNTER — Encounter (HOSPITAL_COMMUNITY): Payer: Self-pay | Admitting: Emergency Medicine

## 2014-11-04 ENCOUNTER — Ambulatory Visit (INDEPENDENT_AMBULATORY_CARE_PROVIDER_SITE_OTHER): Payer: Self-pay | Admitting: Neurology

## 2014-11-04 ENCOUNTER — Encounter: Payer: Self-pay | Admitting: Neurology

## 2014-11-04 VITALS — BP 122/82 | HR 78 | Resp 16 | Ht 62.0 in | Wt 224.0 lb

## 2014-11-04 DIAGNOSIS — G40319 Generalized idiopathic epilepsy and epileptic syndromes, intractable, without status epilepticus: Secondary | ICD-10-CM

## 2014-11-04 DIAGNOSIS — J040 Acute laryngitis: Secondary | ICD-10-CM

## 2014-11-04 DIAGNOSIS — R413 Other amnesia: Secondary | ICD-10-CM

## 2014-11-04 DIAGNOSIS — R569 Unspecified convulsions: Secondary | ICD-10-CM

## 2014-11-04 HISTORY — DX: Unspecified convulsions: R56.9

## 2014-11-04 LAB — POCT RAPID STREP A: STREPTOCOCCUS, GROUP A SCREEN (DIRECT): NEGATIVE

## 2014-11-04 MED ORDER — PREDNISOLONE 15 MG/5ML PO SYRP: ORAL_SOLUTION | ORAL | Status: DC

## 2014-11-04 MED ORDER — IBUPROFEN 800 MG PO TABS
800.0000 mg | ORAL_TABLET | Freq: Once | ORAL | Status: AC
  Administered 2014-11-04: 800 mg via ORAL

## 2014-11-04 MED ORDER — LAMOTRIGINE ER 100 MG PO TB24: ORAL_TABLET | ORAL | Status: DC

## 2014-11-04 MED ORDER — IBUPROFEN 100 MG/5ML PO SUSP
ORAL | Status: AC
  Filled 2014-11-04: qty 40

## 2014-11-04 MED ORDER — CARBAMAZEPINE ER 400 MG PO TB12: ORAL_TABLET | ORAL | Status: DC

## 2014-11-04 MED ORDER — DIVALPROEX SODIUM ER 500 MG PO TB24: ORAL_TABLET | ORAL | Status: DC

## 2014-11-04 MED ORDER — HYDROCODONE-ACETAMINOPHEN 7.5-325 MG/15ML PO SOLN: 15.0000 mL | ORAL | Status: DC | PRN

## 2014-11-04 NOTE — ED Provider Notes (Signed)
Chief Complaint   Hoarse; Cough; and Nasal Congestion   History of Present Illness   Amanda Roach is a 58 year old female who's had a four-day history of severe sore throat, pain on swallowing, and hoarseness. The patient states she is able to swallow some liquids and solids, but it hurts. She is able to swallow her saliva. She's felt hot and chilled and had sweats. Her neck is sore. She's had headache, nasal congestion, green rhinorrhea, dry cough, abdominal pain, and dizziness.  Review of Systems   Other than as noted above, the patient denies any of the following symptoms: Systemic:  No fevers, chills, sweats, or myalgias. Eye:  No redness or discharge. ENT:  No ear pain, headache, nasal congestion, drainage, sinus pressure, or sore throat. Neck:  No neck pain, stiffness, or swollen glands. Lungs:  No cough, sputum production, hemoptysis, wheezing, chest tightness, shortness of breath or chest pain. GI:  No abdominal pain, nausea, vomiting or diarrhea.  [place]   Past medical history, family history, social history, meds, and allergies were reviewed. She has a history of seizure disorder, hypertension, and hypercholesterolemia. She takes Seroquel, Depakote, Lipitor, and Benicar.  Physical exam   Vital signs:  BP 141/80 mmHg  Pulse 80  Temp(Src) 98.6 F (37 C) (Oral)  Resp 18  SpO2 96% General:  Alert and oriented.  In no distress.  Skin warm and dry. Speech and phonation are normal. She is able to handle her secretions without any difficulty. Eye:  No conjunctival injection or drainage. Lids were normal. ENT:  TMs and canals were normal, without erythema or inflammation.  Nasal mucosa was clear and uncongested, without drainage.  Mucous membranes were moist.  Pharynx was slightly erythematous without any ulcerations, exudate, swelling, or masses. Uvula was midline. There was no bulging of the anterior tonsillar pillars or the posterior pharynx.  There were no oral ulcerations  or lesions. Neck:  Supple, no adenopathy, tenderness or mass. Lungs:  No respiratory distress.  Lungs were clear to auscultation, without wheezes, rales or rhonchi.  Breath sounds were clear and equal bilaterally.  Heart:  Regular rhythm, without gallops, murmers or rubs. Skin:  Clear, warm, and dry, without rash or lesions.  Labs   Results for orders placed or performed during the hospital encounter of 11/04/14  POCT rapid strep A [facility])  Result Value Ref Range   Streptococcus, Group A Screen (Direct) NEGATIVE NEGATIVE    Course in Urgent [place]   The following medications were given:  Medications  ibuprofen (ADVIL,MOTRIN) tablet 800 mg (800 mg Oral Given 11/04/14 1335)   Assessment     The encounter diagnosis was Laryngitis.  There is no evidence of pneumonia, strep throat, sinusitis, otitis media.    Plan    1.  Meds:  The following meds were prescribed:   Discharge Medication List as of 11/04/2014  2:16 PM    START taking these medications   Details  HYDROcodone-acetaminophen (HYCET) 7.5-325 mg/15 ml solution Take 15 mLs by mouth every 4 (four) hours as needed for moderate pain or severe pain., Starting 11/04/2014, Until Discontinued, Print    prednisoLONE (PRELONE) 15 MG/5ML syrup Take 10 mL BID for 5 days., Print        2.  Patient Education/Counseling:  The patient was given appropriate handouts, self care instructions, and instructed in symptomatic relief.  Instructed to get extra fluids and extra rest.    3.  Follow up:  The patient was told  to follow up here if no better in 3 to 4 days, or sooner if becoming worse in any way, and given some red flag symptoms such as increasing fever, difficulty breathing, chest pain, or persistent vomiting which would prompt immediate return.       Harden Mo, MD 11/04/14 [phone]

## 2014-11-04 NOTE — Patient Instructions (Signed)
1. Increase Lamictal XR to 100mg : Take 1 tablet daily 2. Continue current doses of Depakote and Tegretol 3. Follow-up in 3 months  Seizure Precautions: 1. If medication has been prescribed for you to prevent seizures, take it exactly as directed.  Do not stop taking the medicine without talking to your doctor first, even if you have not had a seizure in a long time.   2. Avoid activities in which a seizure would cause danger to yourself or to others.  Don't operate dangerous machinery, swim alone, or climb in high or dangerous places, such as on ladders, roofs, or girders.  Do not drive unless your doctor says you may.  3. If you have any warning that you may have a seizure, lay down in a safe place where you can't hurt yourself.    4.  No driving for 6 months from last seizure, as per [facility].   Please refer to the following link on the [place] website for more information: http://[email or web address].org/[email or web address]/driving/drivingu.cfm   5.  Maintain good sleep hygiene.  6.  Notify your neurology if you are planning pregnancy or if you become pregnant.  7.  Contact your doctor if you have any problems that may be related to the medicine you are taking.  8.  Call 911 and bring the patient back to the ED if:        A.  The seizure lasts longer than 5 minutes.       B.  The patient doesn't awaken shortly after the seizure  C.  The patient has new problems such as difficulty seeing, speaking or moving  D.  The patient was injured during the seizure  E.  The patient has a temperature over 102 F (39C)  F.  The patient vomited and now is having trouble breathing

## 2014-11-04 NOTE — Progress Notes (Signed)
NEUROLOGY FOLLOW UP OFFICE NOTE  JEANA KERSTING [id number]  HISTORY OF PRESENT ILLNESS: I had the pleasure of seeing Dynasty Holquin in follow-up in the neurology clinic on 11/04/2014.  The patient was last seen 2 months ago for seizures and memory loss. EEG consistent with primary generalized epilepsy with frequent 1-5 second bursts of generalized discharges. I personally reviewed MRI brain with and without contrast which did not show any acute changes, hippocampi symmetric with no abnormal signal or enhancement. There was mild diffuse atrophy, mild chronic microvascular changes noted. On her last visit, she reported slight improvement with addition of Lamictal. Dose was increased to 50mg /day, she reports that there are days that she does not have any seizures, other days she has 3-5 seizures in a day. Last seizure was yesterday. She woke up two nights around Christmas with her tongue bitten, no incontinence. She continues to take Tegretol XR 800mg /day and Depakote ER 1000mg /day.   She presents today with a sore throat, speaking in a whisper. She feels she had a fever but did not check her temperature. She reports that mood is "okay," she still gets angry and just wants to be by herself. She reports that Christmas was not happy, she found out her sister was "taking advantage of my sickness." She continues to see her psychiatrist 2-3 times a month. She continues to have memory issues, stating that memory is clearer but she still cannot recall where to go sometimes. She is having more headaches but this may be due to current infection. She denies any dizziness, some blurred vision but no diplopia, no focal numbness/tingling/weakness.   HPI: This is a 58 yo RH woman with a history of hypertension, depression, and seizures since age 12 or 29, presenting for worsening memory and continued seizures. She and her sister report seizures started at age 58 or 15, she has "petit" ones and generalized convulsions. The  "petit" seizures consist of staring and unresponsiveness with slight shaking of the head lasting 1 minute, sometimes occurring in clusters of 3 ro 4 within a 3 hour interval. They deny any lip smacking, dystonic posturing, or automatisms. She has had urinary incontinence and some tongue bite with these. She knows when she is having one, she feels her head turning to the right but cannot control it, loses focus, forgetting what she was doing, followed by generalized weakness. She lives alone and reports having the petit ones 2-3 times daily. She denies any generalized convulsions in at least 20 years. Her sister describes right head version with some of the convulsions. She denies any gustatory hallucinations, deja vu, rising epigastric sensation, focal numbness/tingling/weakness. She has had body jerks since childhood. She has told her sister she has smelled "poop" several times.   She and her sister report that she has always had memory problems as a child, but this has greatly increased in the past 20 years. She would say something that didn't happen, or forget things she was supposed to do. Her sister is concerned about how she had given her phone number to strangers and could not recall if she had given them the wrong number. She lives alone but family has stayed in contact with her daily. She would get paranoid if they do not call her. She has been working with her therapist on this, and has a schedule book. She reports that her mood is really good some days, but other days "it's just no good," she sits and is "totally depressed." She can get  very angry and violent at times. She denies any suicidal ideation. She takes Seroquel at bedtime.  Epilepsy Risk Factors: Her father and 2 sisters had seizures in childhood. Otherwise she had a normal birth and early development. There is no history of febrile convulsions, CNS infections such as meningitis/encephalitis, significant traumatic brain injury,  neurosurgical procedures  Prior AEDs: She recalls trying Dilantin and Phenobarbital when younger.   Diagnostic Data: Routine EEG was abnormal with occasional focal slowing over the left posterior temporal region, as well as frequent bursts of generalized high voltage irregular 4 Hz spike and wave discharges with frontal predominance lasting 1-4 seconds predominantly in drowsiness and sleep, consistent with a primary generalized epilepsy.  Her 48-hour EEG showed similar occasional focal slowing over the left temporal region, and frequent generalized 4-5 Hz spike and polyspike and wave discharges lasting 1-5 seconds. There was shifting lead-in noted over the left and right frontal regions, raising the possibility of secondary bisynchrony. She did not write her seizures down in the diary, but reports she had a lot. It is still unclear if the epileptiform discharges lasting more than 3 seconds are symptomatic as she did not write down the times of her seizures. There were no prolonged discharges lasting more than 10 seconds seen.   Last levels: 09/04/14 Depakote 80.7; Lamictal 1.2 (on 50mg /day)  PAST MEDICAL HISTORY: Past Medical History  Diagnosis Date  . Seizure   . Hypertension   . Knee pain   . Asthma   . Hyperlipidemia     MEDICATIONS: Current Outpatient Prescriptions on File Prior to Visit  Medication Sig Dispense Refill  . acetaminophen-codeine (TYLENOL #3) 300-30 MG per tablet Take 1 tablet by mouth every 4 (four) hours as needed. 60 tablet 0  . atorvastatin (LIPITOR) 10 MG tablet Take 1 tablet (10 mg total) by mouth daily. 90 tablet 3  . carbamazepine (TEGRETOL XR) 400 MG 12 hr tablet Take 2 tabs daily 60 tablet 11  . divalproex (DEPAKOTE ER) 500 MG 24 hr tablet Take 2 tabs daily 60 tablet 11  . esomeprazole (NEXIUM) 40 MG capsule Take 1 capsule (40 mg total) by mouth daily before breakfast. 30 capsule 0  . LamoTRIgine (LAMICTAL XR) 25 MG TB24 tablet Take 1 tablet every other day  for 2 weeks, then increase to 1 tablet daily for 2 weeks, then increase to 2 tablets daily and continue (Patient taking differently: Take 1 tablet twice daily) 60 tablet 3  . olmesartan-hydrochlorothiazide (BENICAR HCT) 20-12.5 MG per tablet Take 1 tablet by mouth daily. 30 tablet 4  . QUEtiapine (SEROQUEL) 400 MG tablet Take 400 mg by mouth at bedtime.     No current facility-administered medications on file prior to visit.    ALLERGIES: No Known Allergies  FAMILY HISTORY: No family history on file.  SOCIAL HISTORY: History   Social History  . Marital Status: Divorced    Spouse Name: N/A    Number of Children: N/A  . Years of Education: N/A   Occupational History  . Not on file.   Social History Main Topics  . Smoking status: Never Smoker   . Smokeless tobacco: Not on file  . Alcohol Use: No  . Drug Use: No  . Sexual Activity: Not on file   Other Topics Concern  . Not on file   Social History Narrative    REVIEW OF SYSTEMS: Constitutional: No fevers, chills, or sweats, no generalized fatigue, change in appetite Eyes: No visual changes, double vision, eye pain  Ear, nose and throat: No hearing loss, ear pain, nasal congestion, sore throat Cardiovascular: No chest pain, palpitations Respiratory:  No shortness of breath at rest or with exertion, wheezes GastrointestinaI: No nausea, vomiting, diarrhea, abdominal pain, fecal incontinence Genitourinary:  No dysuria, urinary retention or frequency Musculoskeletal:  No neck pain, back pain Integumentary: No rash, pruritus, skin lesions Neurological: as above Psychiatric: + depression, insomnia, anxiety Endocrine: No palpitations, fatigue, diaphoresis, mood swings, change in appetite, change in weight, increased thirst Hematologic/Lymphatic:  No anemia, purpura, petechiae. Allergic/Immunologic: no itchy/runny eyes, nasal congestion, recent allergic reactions, rashes  PHYSICAL EXAM: Filed Vitals:   11/04/14 1049  BP:  122/82  Pulse: 78  Resp: 16   General: No acute distress, appears more depressed today, speaks in whisper due to sore throat Head:  Normocephalic/atraumatic Neck: supple, no paraspinal tenderness, full range of motion Heart:  Regular rate and rhythm Lungs:  Clear to auscultation bilaterally Back: No paraspinal tenderness Skin/Extremities: No rash, no edema Neurological Exam: alert and oriented to person, place, and time. No aphasia or dysarthria. Fund of knowledge is appropriate.  Recent and remote memory are intact.  Attention and concentration are normal.    Able to name objects and repeat phrases. Cranial nerves: Pupils equal, round, reactive to light.  Fundoscopic exam unremarkable, no papilledema. Extraocular movements intact with no nystagmus. Visual fields full. Facial sensation intact. No facial asymmetry. Tongue, uvula, palate midline.  Motor: Bulk and tone normal, muscle strength 5/5 throughout with no pronator drift.  Sensation to light touch intact.  No extinction to double simultaneous stimulation.  Deep tendon reflexes 2+ throughout, toes downgoing.  Finger to nose testing intact.  Gait narrow-based and steady, able to tandem walk adequately.  Romberg negative.  IMPRESSION: This is a 58 yo RH woman with a history of seizures since childhood with episodes of staring and unresponsiveness with head twitch to the right, and infrequent generalized convulsions, none in at least 20 years. She had presented with worsening memory. 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 MRI brain unremarkable. She has reported a decrease in seizures with addition of Lamictal, continue to uptitrate to Lamictal XR 100mg /day. Continue current doses of Depakote ER 1000mg /day and Tegretol XR  800mg /day.  Continue follow-up with psychiatry. She does not drive and understands [place] driving laws. Continue to work with her psychiatrist. She will follow-up in 3 months.  Thank you for allowing me to participate in her care.  Please do not hesitate to call for any questions or concerns.  The duration of this appointment visit was 15 minutes of face-to-face time with the patient.  Greater than 50% of this time was spent in counseling, explanation of diagnosis, planning of further management, and coordination of care.   Ellouise Newer, M.D.   CC: Dr. Doreene Burke

## 2014-11-04 NOTE — Discharge Instructions (Signed)
Laryngitis °At the top of your windpipe is your voice box. It is the source of your voice. Inside your voice box are 2 bands of muscles called vocal cords. When you breathe, your vocal cords are relaxed and open so that air can get into the lungs. When you decide to say something, these cords come together and vibrate. The sound from these vibrations goes into your throat and comes out through your mouth as sound.  °Laryngitis is an inflammation of the vocal cords that causes hoarseness, cough, loss of voice, sore throat, and dry throat. Laryngitis can be temporary (acute) or long-term (chronic). Most cases of acute laryngitis improve with time.Chronic laryngitis lasts for more than 3 weeks. °CAUSES °Laryngitis can often be related to excessive smoking, talking, or yelling, as well as inhalation of toxic fumes and allergies. Acute laryngitis is usually caused by a viral infection, vocal strain, measles or mumps, or bacterial infections. Chronic laryngitis is usually caused by vocal cord strain, vocal cord injury, postnasal drip, growths on the vocal cords, or acid reflux. °SYMPTOMS  °· Cough. °· Sore throat. °· Dry throat. °RISK FACTORS °· Respiratory infections. °· Exposure to irritating substances, such as cigarette smoke, excessive amounts of alcohol, stomach acids, and workplace chemicals. °· Voice trauma, such as vocal cord injury from shouting or speaking too loud. °DIAGNOSIS  °Your cargiver will perform a physical exam. During the physical exam, your caregiver will examine your throat. The most common sign of laryngitis is hoarseness. Laryngoscopy may be necessary to confirm the diagnosis of this condition. This procedure allows your caregiver to look into the larynx. °HOME CARE INSTRUCTIONS °· Drink enough fluids to keep your urine clear or pale yellow. °· Rest until you no longer have symptoms or as directed by your caregiver. °· Breathe in moist air. °· Take all medicine as directed by your  caregiver. °· Do not smoke. °· Talk as little as possible (this includes whispering). °· Write on paper instead of talking until your voice is back to normal. °· Follow up with your caregiver if your condition has not improved after 10 days. °SEEK MEDICAL CARE IF:  °· You have trouble breathing. °· You cough up blood. °· You have persistent fever. °· You have increasing pain. °· You have difficulty swallowing. °MAKE SURE YOU: °· Understand these instructions. °· Will watch your condition. °· Will get help right away if you are not doing well or get worse. °Document Released: 10/25/2005 Document Revised: 01/17/2012 Document Reviewed: 12/31/2010 °ExitCare® Patient Information ©2015 [employer]. This information is not intended to replace advice given to you by your health care provider. Make sure you discuss any questions you have with your health care provider. ° °

## 2014-11-04 NOTE — ED Notes (Signed)
Pt states that she has been congested with cough for a few days and lost her voice 11/02/2015

## 2014-11-06 LAB — CULTURE, GROUP A STREP

## 2014-11-11 ENCOUNTER — Telehealth: Payer: Self-pay | Admitting: Neurology

## 2014-11-11 NOTE — Telephone Encounter (Signed)
Received call report from answering service. Pt called in with questions regarding Lamictal script. Please follow up with patient, CB# [phone] / [name]

## 2014-11-11 NOTE — Telephone Encounter (Signed)
Returned call to the pharmacy. They wanted to verify that patient was increased to Lamictal  XR 100 mg. I did clarify that at pts last ov Dr. Delice Lesch did increase Lamictal XR to 100 mg.

## 2014-11-12 ENCOUNTER — Telehealth: Payer: Self-pay | Admitting: Neurology

## 2014-11-12 NOTE — Telephone Encounter (Signed)
Pt came by the office to let Dr. Delice Lesch know that her pharmacy did not have any LamoTRIgine 100mg  in stock and they were not sure when they were going To receive any. Pt's pharmacy advice her to come by the office and see if she can get any free samples until her pharmacy receives some in stock. Pt would like to know if she can receive free samples. C/b [phone]

## 2014-11-12 NOTE — Telephone Encounter (Signed)
Called patient's pharmacy/[facility]. Left msg for someone to call me back. Need to know if they have any other strengths available of the lamotrigine to give to patient to and directions can be adjusted to equal new directions.

## 2014-11-13 MED ORDER — LAMOTRIGINE 100 MG PO TABS: 100.0000 mg | ORAL_TABLET | Freq: Every day | ORAL | Status: DC

## 2014-11-13 NOTE — Telephone Encounter (Signed)
I called patient after still not hearing from her pharmacy after leaving another message on their machine this morning. Wanted to find out from her if there is another number for them or if she has a contact at the health dept that she usually deals with that can get the pharmacy to return my call. She states she will give them a call & if she couldn't get anybody she would go by there since she states that they are not that far from her.

## 2014-11-13 NOTE — Telephone Encounter (Signed)
I did receive a call from [facility] the patient was there at the pharmacy & I was speaking to both her & Dawn on speaker phone. Patient is a part of a program where she receives Lamictal for free from the manufacturer. They don't have any samples of any other strengths of Lamictal at this time. The new Rx was sent to manufacturer on Monday to be processed so they are waiting on Rx to be processed & mailed to them to dispense to patient. Patient did let us know today that she hasn't had any Lamictal since around 12/31. I spoke with Dr. Delice Lesch about this & she suggested to see if patient could afford a few days supply out of pocket at a pharmacy because she needed to be back on the medicine. She states that we could call in the immediate release since that tends to be cheaper than the XR. I got back on the phone with patient & she stated that she could try & get the money from her sister depending on how much Rx would be for a 7 day supply. Dawn suggested that I call [place] for Rx & let them know that it was for a Health Dept pt. They ususally will offer a discount. I told patient that I would call the pharmacy for a 7 day supple & call her back with pricing.  I called [facility] for Lamictal 100mg  take 1 tablet daily # 7/0 rf's. Out of pocket cost to patient will be $4.00.  I called patient to let her know this & she will go by & pick up Rx.

## 2014-12-10 ENCOUNTER — Ambulatory Visit: Payer: Self-pay

## 2014-12-18 ENCOUNTER — Ambulatory Visit: Payer: Self-pay | Admitting: Physician Assistant

## 2014-12-18 ENCOUNTER — Encounter: Payer: Self-pay | Admitting: Physician Assistant

## 2014-12-18 ENCOUNTER — Telehealth: Payer: Self-pay | Admitting: Neurology

## 2014-12-18 NOTE — Telephone Encounter (Signed)
Is she taking Lamictal XR 100mg  daily?  We can switch her to the immediate release tablet and have her start 75mg  twice daily (total of 150mg )

## 2014-12-18 NOTE — Progress Notes (Unsigned)
Patient indicates she has had abdominal pain x 3 years. Taking Nexium. Denies pain at this time. Increased seizures. Being followed by Neurologist. Sore throat x 2 days. Afebrile.  Addendum-Patient triaged and appointment rescheduled. Patient will not be seen today. She will be seen by PCP Chari Manning, NP on 12/27/14.

## 2014-12-18 NOTE — Telephone Encounter (Signed)
Pt would like to talk to someone about how many seizures she is having a week. She states that she is having about 17 a week please call [phone]

## 2014-12-18 NOTE — Telephone Encounter (Signed)
I did speak with patient & she states that her seizures are better she is still having small ones. She states she's NOT having 17 a week, she is having 17-20 total in a month. She states she has been taking her medicine as prescribed & hasn't missed any doses, she is on Depakote, Tegretol, & Lamictal. She is scheduled for a follow-up with Dr. Delice Lesch in March but wants to come in sooner. I am going to move her up on the scheduled to see Dr. Delice Lesch next week. Any other advisement for her?

## 2014-12-18 NOTE — Telephone Encounter (Signed)
Called patient lmovm to return my call.

## 2014-12-24 NOTE — Telephone Encounter (Signed)
Patient was notified of advisement. I did advise for her to give [place] a call if seizures increase or any other changes occur before her next office visit in March.

## 2014-12-24 NOTE — Telephone Encounter (Signed)
Lamictal was increased 2 months ago, would wait another month before making additional changes, unless seizures increasing. Thanks

## 2014-12-24 NOTE — Telephone Encounter (Signed)
I called her again this morning & was able to speak with her. I did ask her is she still wanted to move her appt up, she said that she would wait until 3/28 to come in. Is there anything that you want her to do? Again she stated that she is doing better than the last time you saw her, but she is still having some of the smaller seizures.

## 2014-12-27 ENCOUNTER — Encounter: Payer: Self-pay | Admitting: Internal Medicine

## 2014-12-27 ENCOUNTER — Ambulatory Visit: Payer: Self-pay | Attending: Internal Medicine | Admitting: Internal Medicine

## 2014-12-27 VITALS — BP 136/83 | HR 87 | Temp 97.3°F | Resp 16 | Ht 61.0 in | Wt 227.0 lb

## 2014-12-27 DIAGNOSIS — R569 Unspecified convulsions: Secondary | ICD-10-CM | POA: Insufficient documentation

## 2014-12-27 DIAGNOSIS — E785 Hyperlipidemia, unspecified: Secondary | ICD-10-CM | POA: Insufficient documentation

## 2014-12-27 DIAGNOSIS — N3000 Acute cystitis without hematuria: Secondary | ICD-10-CM

## 2014-12-27 DIAGNOSIS — I10 Essential (primary) hypertension: Secondary | ICD-10-CM | POA: Insufficient documentation

## 2014-12-27 DIAGNOSIS — Z79899 Other long term (current) drug therapy: Secondary | ICD-10-CM | POA: Insufficient documentation

## 2014-12-27 LAB — POCT URINALYSIS DIPSTICK
Bilirubin, UA: NEGATIVE
Glucose, UA: NEGATIVE
KETONES UA: NEGATIVE
Leukocytes, UA: NEGATIVE
Nitrite, UA: NEGATIVE
PH UA: 6.5
PROTEIN UA: NEGATIVE
Spec Grav, UA: 1.02
Urobilinogen, UA: 0.2

## 2014-12-27 MED ORDER — CIPROFLOXACIN HCL 500 MG PO TABS: 500.0000 mg | ORAL_TABLET | Freq: Two times a day (BID) | ORAL | Status: DC

## 2014-12-27 NOTE — Progress Notes (Signed)
Patient ID: Amanda Roach, female   DOB: January 30, 1956, 59 y.o.   MRN: [id number]  CC: follow up  HPI: Amanda Roach is a 59 y.o. female here today for a follow up visit.  Patient has past medical history of seizure, HTN, asthma, and HLD.  Patient reports that she has been living in her house with mold in it and now has been having issues with her health. She states that she is now having abdominal pain, nose burning, losing voice, and frequent headaches. She states that she has mold on her clothes, shoes, window seals, and closet. She has abdominal pain, dysuria, no frequency of urination.   Patient has No headache, No abdominal pain - No Nausea, No new weakness tingling or numbness, No Cough - SOB.  No Known Allergies Past Medical History  Diagnosis Date  . Seizure   . Hypertension   . Knee pain   . Asthma   . Hyperlipidemia   . Seizures    Current Outpatient Prescriptions on File Prior to Visit  Medication Sig Dispense Refill  . acetaminophen-codeine (TYLENOL #3) 300-30 MG per tablet Take 1 tablet by mouth every 4 (four) hours as needed. 60 tablet 0  . atorvastatin (LIPITOR) 10 MG tablet Take 1 tablet (10 mg total) by mouth daily. 90 tablet 3  . carbamazepine (TEGRETOL XR) 400 MG 12 hr tablet Take 2 tabs daily 60 tablet 11  . divalproex (DEPAKOTE ER) 500 MG 24 hr tablet Take 2 tabs daily 60 tablet 11  . esomeprazole (NEXIUM) 40 MG capsule Take 1 capsule (40 mg total) by mouth daily before breakfast. 30 capsule 0  . HYDROcodone-acetaminophen (HYCET) 7.5-325 mg/15 ml solution Take 15 mLs by mouth every 4 (four) hours as needed for moderate pain or severe pain. 120 mL 0  . lamoTRIgine (LAMICTAL) 100 MG tablet Take 1 tablet (100 mg total) by mouth daily. 7 tablet 0  . LamoTRIgine 100 MG TB24 Take 1 tablet daily 30 tablet 6  . olmesartan-hydrochlorothiazide (BENICAR HCT) 20-12.5 MG per tablet Take 1 tablet by mouth daily. 30 tablet 4  . QUEtiapine (SEROQUEL) 400 MG tablet Take 400 mg by mouth  at bedtime.    . prednisoLONE (PRELONE) 15 MG/5ML syrup Take 10 mL BID for 5 days. (Patient not taking: Reported on 12/18/2014) 100 mL 0   No current facility-administered medications on file prior to visit.   History reviewed. No pertinent family history. History   Social History  . Marital Status: Divorced    Spouse Name: N/A  . Number of Children: N/A  . Years of Education: N/A   Occupational History  . Not on file.   Social History Main Topics  . Smoking status: Never Smoker   . Smokeless tobacco: Not on file  . Alcohol Use: No  . Drug Use: No  . Sexual Activity: Not on file   Other Topics Concern  . Not on file   Social History Narrative    Review of Systems: See HPI   Objective:   Filed Vitals:   12/27/14 1421  BP: 136/83  Pulse: 87  Temp: 97.3 F (36.3 C)  Resp: 16    Physical Exam  Constitutional: She is oriented to person, place, and time.  Cardiovascular: Normal rate, regular rhythm and normal heart sounds.   Pulmonary/Chest: Effort normal and breath sounds normal.  Lymphadenopathy:    She has no cervical adenopathy.  Neurological: She is alert and oriented to person, place, and time.  Skin: Skin is  warm and dry.  Psychiatric: She has a normal mood and affect.    Lab Results  Component Value Date   WBC 4.3 07/01/2014   HGB 12.3 07/01/2014   HCT 35.8* 07/01/2014   MCV 85.6 07/01/2014   PLT 283 07/01/2014   Lab Results  Component Value Date   CREATININE 1.05 09/04/2014   BUN 20 09/04/2014   NA 139 07/01/2014   K 4.3 07/01/2014   CL 100 07/01/2014   CO2 32 07/01/2014    Lab Results  Component Value Date   HGBA1C 5.6 07/01/2014   Lipid Panel     Component Value Date/Time   CHOL 244* 07/01/2014 1304   TRIG 161* 07/01/2014 1304   HDL 63 07/01/2014 1304   CHOLHDL 3.9 07/01/2014 1304   VLDL 32 07/01/2014 1304   LDLCALC 149* 07/01/2014 1304       Assessment and plan:   Amanda Roach was seen today for follow-up.  Diagnoses and all  orders for this visit:  Acute cystitis without hematuria Orders: -     POCT urinalysis dipstick -    Begin ciprofloxacin (CIPRO) 500 MG tablet; Take 1 tablet (500 mg total) by mouth 2 (two) times daily.  Return if symptoms worsen or fail to improve.        Chari Manning, NP-C [facility] [phone] 12/27/2014, 3:12 PM

## 2014-12-27 NOTE — Patient Instructions (Signed)

## 2014-12-27 NOTE — Progress Notes (Signed)
Pt is here today stating that her house has mold in it.  She said that she now has abdomen pain, her voice frequently goes out and she is having frequent headaches.

## 2015-01-01 ENCOUNTER — Ambulatory Visit: Payer: Self-pay

## 2015-01-16 ENCOUNTER — Ambulatory Visit: Payer: Self-pay | Attending: Internal Medicine | Admitting: Internal Medicine

## 2015-01-16 ENCOUNTER — Encounter: Payer: Self-pay | Admitting: Internal Medicine

## 2015-01-16 VITALS — BP 107/74 | HR 80 | Temp 98.4°F | Resp 18 | Ht 61.0 in | Wt 226.0 lb

## 2015-01-16 DIAGNOSIS — M129 Arthropathy, unspecified: Secondary | ICD-10-CM

## 2015-01-16 DIAGNOSIS — M1711 Unilateral primary osteoarthritis, right knee: Secondary | ICD-10-CM

## 2015-01-16 DIAGNOSIS — R1084 Generalized abdominal pain: Secondary | ICD-10-CM | POA: Insufficient documentation

## 2015-01-16 DIAGNOSIS — M13861 Other specified arthritis, right knee: Secondary | ICD-10-CM | POA: Insufficient documentation

## 2015-01-16 MED ORDER — MELOXICAM 15 MG PO TABS: 15.0000 mg | ORAL_TABLET | Freq: Every day | ORAL | Status: DC

## 2015-01-16 NOTE — Progress Notes (Signed)
F/U low abdominal pain Complaining of lt Knee pain  Knee pain worsen and felt popping feeling  Hx injur

## 2015-01-16 NOTE — Progress Notes (Signed)
   Subjective:    Patient ID: Amanda Roach, female    DOB: Aug 30, 1956, 59 y.o.   MRN: [id number]  Knee Pain  The injury mechanism was a fall (2 years ago and has had pain since then). The pain is present in the right knee. The quality of the pain is described as aching (felt a pop 3 days ago). The pain is moderate. The pain has been worsening since onset. Associated symptoms comments: Has to now pick up her leg to bend or move . The symptoms are aggravated by weight bearing. She has tried NSAIDs for the symptoms. The treatment provided mild relief.  Abdominal Pain This is a chronic problem. The current episode started more than 1 year ago. The problem occurs intermittently. The problem has been unchanged (Has been to GI in 2014). The pain is located in the generalized abdominal region. The quality of the pain is cramping (twisting). Associated symptoms include anorexia. Pertinent negatives include no constipation, diarrhea, nausea or vomiting. Nothing aggravates the pain. The pain is relieved by nothing. She has tried nothing for the symptoms. Prior diagnostic workup includes upper endoscopy. Her past medical history is significant for GERD. There is no history of abdominal surgery, Crohn's disease or irritable bowel syndrome.      Review of Systems  Gastrointestinal: Positive for abdominal pain and anorexia. Negative for nausea, vomiting, diarrhea and constipation.       Objective:   Physical Exam  Constitutional: She is oriented to person, place, and time.  Cardiovascular: Normal rate, regular rhythm and normal heart sounds.   Pulmonary/Chest: Effort normal and breath sounds normal.  Abdominal: Soft. Bowel sounds are normal. There is tenderness (generalized).  Musculoskeletal: She exhibits no edema.  Neurological: She is alert and oriented to person, place, and time.  Skin: Skin is warm and dry.  Psychiatric: She has a normal mood and affect.      Assessment & Plan:  Eman was seen  today for follow-up and knee pain.  Diagnoses and all orders for this visit:  Generalized abdominal pain Orders: -     US Abdomen Complete; Future -     Ambulatory referral to Gastroenterology  Arthritis of right knee Orders: -     Ambulatory referral to Orthopedic Surgery -     meloxicam (MOBIC) 15 MG tablet; Take 1 tablet (15 mg total) by mouth daily. Explained that she may use tylenol through the day for additional pain control.   Return if symptoms worsen or fail to improve.  Chari Manning, NP 01/16/2015 4:51 PM

## 2015-01-16 NOTE — Patient Instructions (Signed)
I will send referral to GI and Orthopedics, you should receive a call over the next 2 weeks.

## 2015-01-22 ENCOUNTER — Telehealth: Payer: Self-pay | Admitting: General Practice

## 2015-01-22 NOTE — Telephone Encounter (Signed)
Patient presents to clinic requesting to schedule appointment for referral to be sent to a specialist to check for "mold contamination". Patient states she has had mold in her home. States that the inspector has come out to her home, and she is planning to move but is worried that she may be "contaminated". Informed patient that I would be providing this information to her nurse and that the clinical staff would let us know how to move forward. If an appointment is necessary, we would be glad to schedule for patient. Please assist. Thanks

## 2015-01-23 ENCOUNTER — Telehealth: Payer: Self-pay | Admitting: Emergency Medicine

## 2015-01-23 NOTE — Telephone Encounter (Signed)
Pt instructed to come to clinic next Monday with Dr. Doreene Burke  Pt will be coming at [facility]

## 2015-01-27 ENCOUNTER — Encounter: Payer: Self-pay | Admitting: Internal Medicine

## 2015-01-27 ENCOUNTER — Ambulatory Visit (HOSPITAL_COMMUNITY)
Admission: RE | Admit: 2015-01-27 | Discharge: 2015-01-27 | Disposition: A | Discharge status: Home / Self Care | Payer: Self-pay | Source: Ambulatory Visit | Attending: Internal Medicine | Admitting: Internal Medicine

## 2015-01-27 ENCOUNTER — Ambulatory Visit: Payer: Self-pay | Attending: Internal Medicine | Admitting: Internal Medicine

## 2015-01-27 VITALS — BP 107/66 | HR 92 | Temp 98.3°F | Resp 18

## 2015-01-27 DIAGNOSIS — R053 Chronic cough: Secondary | ICD-10-CM

## 2015-01-27 DIAGNOSIS — I10 Essential (primary) hypertension: Secondary | ICD-10-CM | POA: Insufficient documentation

## 2015-01-27 DIAGNOSIS — R079 Chest pain, unspecified: Secondary | ICD-10-CM | POA: Insufficient documentation

## 2015-01-27 DIAGNOSIS — J45909 Unspecified asthma, uncomplicated: Secondary | ICD-10-CM | POA: Insufficient documentation

## 2015-01-27 DIAGNOSIS — R0602 Shortness of breath: Secondary | ICD-10-CM | POA: Insufficient documentation

## 2015-01-27 DIAGNOSIS — Z77120 Contact with and (suspected) exposure to mold (toxic): Secondary | ICD-10-CM | POA: Insufficient documentation

## 2015-01-27 DIAGNOSIS — Z1239 Encounter for other screening for malignant neoplasm of breast: Secondary | ICD-10-CM | POA: Insufficient documentation

## 2015-01-27 DIAGNOSIS — R05 Cough: Secondary | ICD-10-CM

## 2015-01-27 NOTE — Progress Notes (Signed)
Patient ID: Amanda Roach, female   DOB: May 15, 1956, 59 y.o.   MRN: [id number]   Amanda Roach, is a 59 y.o. female  FWY:[id number]  [id number]  DOB - [birth year]  Chief Complaint  Patient presents with  . Follow-up    mold in home         Subjective:   Amanda Roach is a 59 y.o. female here today for a follow up visit. Patient has history of hypertension, seizure disorder, asthma, hyperlipidemia, chronic pain syndrome and major depression. Patient came in today with complaint of watery eyes, shortness of breath, hoarse voice and chronic cough, she thinks that she has been exposed to mold in her current house. She states that she had spoken to the landlord for suspicion of mold exposure because the house is very moist and humid, she was told to have medical check for mold exposure, the major reason for visit today. She denies headache, no dizziness, she claims compliant with her medication for hypertension and seizure, no complaint of side effects. Patient has No chest pain, No abdominal pain - No Nausea, No new weakness tingling or numbness, No Cough - SOB.  Problem  Chronic Cough  Mold Exposure    ALLERGIES: No Known Allergies  PAST MEDICAL HISTORY: Past Medical History  Diagnosis Date  . Seizure   . Hypertension   . Knee pain   . Asthma   . Hyperlipidemia   . Seizures   . Depression   . Anxiety     MEDICATIONS AT HOME: Prior to Admission medications   Medication Sig Start Date End Date Taking? Authorizing Provider  acetaminophen-codeine (TYLENOL #3) 300-30 MG per tablet Take 1 tablet by mouth every 4 (four) hours as needed. 12/31/13  Yes Tresa Garter, MD  atorvastatin (LIPITOR) 10 MG tablet Take 1 tablet (10 mg total) by mouth daily. 08/13/14  Yes Lance Bosch, NP  carbamazepine (TEGRETOL XR) 400 MG 12 hr tablet Take 2 tabs daily 11/04/14  Yes Cameron Sprang, MD  divalproex (DEPAKOTE ER) 500 MG 24 hr tablet Take 2 tabs daily 11/04/14  Yes Cameron Sprang, MD    esomeprazole (NEXIUM) 40 MG capsule Take 1 capsule (40 mg total) by mouth daily before breakfast. 09/26/14  Yes Tresa Garter, MD  olmesartan-hydrochlorothiazide (BENICAR HCT) 20-12.5 MG per tablet Take 1 tablet by mouth daily. 07/01/14  Yes Lance Bosch, NP  QUEtiapine (SEROQUEL) 400 MG tablet Take 400 mg by mouth at bedtime.   Yes Historical Provider, MD  HYDROcodone-acetaminophen (HYCET) 7.5-325 mg/15 ml solution Take 15 mLs by mouth every 4 (four) hours as needed for moderate pain or severe pain. Patient not taking: Reported on 01/27/2015 11/04/14   Harden Mo, MD  lamoTRIgine (LAMICTAL) 100 MG tablet Take 1 tablet (100 mg total) by mouth daily. Patient not taking: Reported on 01/27/2015 11/13/14   Cameron Sprang, MD  LamoTRIgine 100 MG TB24 Take 1 tablet daily Patient not taking: Reported on 01/16/2015 11/04/14   Cameron Sprang, MD  meloxicam (MOBIC) 15 MG tablet Take 1 tablet (15 mg total) by mouth daily. Patient not taking: Reported on 01/27/2015 01/16/15   Lance Bosch, NP  prednisoLONE (PRELONE) 15 MG/5ML syrup Take 10 mL BID for 5 days. Patient not taking: Reported on 12/18/2014 11/04/14   Harden Mo, MD     Objective:   Filed Vitals:   01/27/15 1029  BP: 107/66  Pulse: 92  Temp: 98.3 F (36.8 C)  TempSrc: Oral  Resp: 18  SpO2: 94%    Exam General appearance : Awake, alert, not in any distress. Speech Clear. Not toxic looking HEENT: Atraumatic and Normocephalic, pupils equally reactive to light and accomodation Neck: supple, no JVD. No cervical lymphadenopathy.  Chest:Good air entry bilaterally, no added sounds  CVS: S1 S2 regular, no murmurs.  Abdomen: Bowel sounds present, Non tender and not distended with no gaurding, rigidity or rebound. Extremities: B/L Lower Ext shows no edema, both legs are warm to touch Neurology: Awake alert, and oriented X 3, CN II-XII intact, Non focal Skin:No Rash  Data Review Lab Results  Component Value Date   HGBA1C 5.6  07/01/2014     Assessment & Plan   1. Chronic cough  - DG Chest 2 View; Future - Mold Profile - Sedimentation rate - ANA  - Continue medication as prescribed. - Symptomatic treatment - Avoid triggers   2. Mold exposure  - Mold Profile - Subsequent management will depend on the result  Patient have been counseled extensively about nutrition and exercise  Return in about 3 months (around 04/29/2015), or if symptoms worsen or fail to improve, for Annual Physical, Follow up HTN.  The patient was given clear instructions to go to ER or return to medical center if symptoms don't improve, worsen or new problems develop. The patient verbalized understanding. The patient was told to call to get lab results if they haven't heard anything in the next week.   This note has been created with Surveyor, quantity. Any transcriptional errors are unintentional.    Angelica Chessman, MD, [place], [place], [place], [place] and [place], [place]   01/27/2015, 10:45 AM

## 2015-01-27 NOTE — Progress Notes (Signed)
Pt comes in with c/o URI/allergy sx's over the past 5 years with worsening C/o frequent sob, hoarse and watery eyes States she spoke with Landlord for mold suspicion but she states she was told the house was very moist Denies headaches,dizziness Taking prescribed medications daily

## 2015-01-27 NOTE — Patient Instructions (Signed)

## 2015-01-28 LAB — MOLD PROFILE
Aspergillus fumigatus, m3: <0.1 kU/L
Cladosporium Herbarum: <0.1 kU/L
Helminthosporium halodes: <0.1 kU/L
Penicillium Notatum: <0.1 kU/L

## 2015-01-28 LAB — SEDIMENTATION RATE: Sed Rate: 1 mm/hr (ref 0–30)

## 2015-01-28 LAB — ANA: Anti Nuclear Antibody(ANA): NEGATIVE

## 2015-02-03 ENCOUNTER — Ambulatory Visit (INDEPENDENT_AMBULATORY_CARE_PROVIDER_SITE_OTHER): Payer: Self-pay | Admitting: Neurology

## 2015-02-03 ENCOUNTER — Encounter: Payer: Self-pay | Admitting: Neurology

## 2015-02-03 VITALS — BP 114/88 | HR 100 | Resp 16 | Ht 62.0 in | Wt 221.0 lb

## 2015-02-03 DIAGNOSIS — G40319 Generalized idiopathic epilepsy and epileptic syndromes, intractable, without status epilepticus: Secondary | ICD-10-CM

## 2015-02-03 DIAGNOSIS — R413 Other amnesia: Secondary | ICD-10-CM

## 2015-02-03 NOTE — Progress Notes (Signed)
NEUROLOGY FOLLOW UP OFFICE NOTE  Amanda Roach [id number]  HISTORY OF PRESENT ILLNESS: I had the pleasure of seeing Amanda Roach in follow-up in the neurology clinic on 02/03/2015.  The patient was last seen 3 months ago for seizures and memory loss. EEG consistent with primary generalized epilepsy with frequent 1-5 second bursts of generalized discharges. On her last visit, Lamictal XR dose was increased to 100mg /day. She continues on Depakote ER 1000mg /day and Tegretol XR 800mg /day. Since her last visit, she reports that she is doing better, she has days where she has no small seizures, other days she has 1-3 in the daytime and 1-5 at night. She has woken up three times with her tongue bitten. She reports falling due to right knee pain, no loss of consciousness. Tylenol does not help with her knee pain. She reports the depression is still there, but she is getting better inch by inch. She denies any headaches, dizziness, diplopia, focal numbness/tingling/weakness.   HPI: This is a 59 yo RH woman with a history of hypertension, depression, and seizures since age 45 or 75, presenting for worsening memory and continued seizures. She and her sister report seizures started at age 42 or 71, she has "petit" ones and generalized convulsions. The "petit" seizures consist of staring and unresponsiveness with slight shaking of the head lasting 1 minute, sometimes occurring in clusters of 3 ro 4 within a 3 hour interval. They deny any lip smacking, dystonic posturing, or automatisms. She has had urinary incontinence and some tongue bite with these. She knows when she is having one, she feels her head turning to the right but cannot control it, loses focus, forgetting what she was doing, followed by generalized weakness. She lives alone and reports having the petit ones 2-3 times daily. She denies any generalized convulsions in at least 20 years. Her sister describes right head version with some of the convulsions.  She denies any gustatory hallucinations, deja vu, rising epigastric sensation, focal numbness/tingling/weakness. She has had body jerks since childhood. She has told her sister she has smelled "poop" several times.   She and her sister report that she has always had memory problems as a child, but this has greatly increased in the past 20 years. She would say something that didn't happen, or forget things she was supposed to do. Her sister is concerned about how she had given her phone number to strangers and could not recall if she had given them the wrong number. She lives alone but family has stayed in contact with her daily. She would get paranoid if they do not call her. She has been working with her therapist on this, and has a schedule book. She reports that her mood is really good some days, but other days "it's just no good," she sits and is "totally depressed." She can get very angry and violent at times. She denies any suicidal ideation. She takes Seroquel at bedtime.  Epilepsy Risk Factors: Her father and 2 sisters had seizures in childhood. Otherwise she had a normal birth and early development. There is no history of febrile convulsions, CNS infections such as meningitis/encephalitis, significant traumatic brain injury, neurosurgical procedures  Prior AEDs: She recalls trying Dilantin and Phenobarbital when younger.   Diagnostic Data: Routine EEG was abnormal with occasional focal slowing over the left posterior temporal region, as well as frequent bursts of generalized high voltage irregular 4 Hz spike and wave discharges with frontal predominance lasting 1-4 seconds predominantly in drowsiness and  sleep, consistent with a primary generalized epilepsy.  48-hour EEG showed similar occasional focal slowing over the left temporal region, and frequent generalized 4-5 Hz spike and polyspike and wave discharges lasting 1-5 seconds. There was shifting lead-in noted over the left and right frontal  regions, raising the possibility of secondary bisynchrony. She did not write her seizures down in the diary, but reports she had a lot. It is still unclear if the epileptiform discharges lasting more than 3 seconds are symptomatic as she did not write down the times of her seizures. There were no prolonged discharges lasting more than 10 seconds seen.  MRI brain with and without contrast 09/2014 did not show any acute changes, hippocampi symmetric with no abnormal signal or enhancement. There was mild diffuse atrophy, mild chronic microvascular changes noted.   Last levels: 09/04/14 Depakote 80.7; Lamictal 1.2 (on 50mg /day)  PAST MEDICAL HISTORY: Past Medical History  Diagnosis Date  . Seizure   . Hypertension   . Knee pain   . Asthma   . Hyperlipidemia   . Seizures   . Depression   . Anxiety     MEDICATIONS: Current Outpatient Prescriptions on File Prior to Visit  Medication Sig Dispense Refill  . atorvastatin (LIPITOR) 10 MG tablet Take 1 tablet (10 mg total) by mouth daily. 90 tablet 3  . carbamazepine (TEGRETOL XR) 400 MG 12 hr tablet Take 2 tabs daily 60 tablet 11  . divalproex (DEPAKOTE ER) 500 MG 24 hr tablet Take 2 tabs daily 60 tablet 11  . lamoTRIgine (LAMICTAL) 100 MG tablet Take 1 tablet (100 mg total) by mouth daily. 7 tablet 0  . olmesartan-hydrochlorothiazide (BENICAR HCT) 20-12.5 MG per tablet Take 1 tablet by mouth daily. 30 tablet 4  . QUEtiapine (SEROQUEL) 400 MG tablet Take 400 mg by mouth at bedtime.    Marland Kitchen esomeprazole (NEXIUM) 40 MG capsule Take 1 capsule (40 mg total) by mouth daily before breakfast. (Patient not taking: Reported on 02/03/2015) 30 capsule 0   No current facility-administered medications on file prior to visit.    ALLERGIES: No Known Allergies  FAMILY HISTORY: Family History  Problem Relation Age of Onset  . Heart disease Mother     SOCIAL HISTORY: History   Social History  . Marital Status: Divorced    Spouse Name: N/A  . Number  of Children: N/A  . Years of Education: N/A   Occupational History  . Not on file.   Social History Main Topics  . Smoking status: Never Smoker   . Smokeless tobacco: Not on file  . Alcohol Use: No  . Drug Use: No  . Sexual Activity: Not on file   Other Topics Concern  . Not on file   Social History Narrative    REVIEW OF SYSTEMS: Constitutional: No fevers, chills, or sweats, no generalized fatigue, change in appetite Eyes: No visual changes, double vision, eye pain Ear, nose and throat: No hearing loss, ear pain, nasal congestion, sore throat Cardiovascular: No chest pain, palpitations Respiratory:  No shortness of breath at rest or with exertion, wheezes GastrointestinaI: No nausea, vomiting, diarrhea, abdominal pain, fecal incontinence Genitourinary:  No dysuria, urinary retention or frequency Musculoskeletal:  No neck pain, back pain Integumentary: No rash, pruritus, skin lesions Neurological: as above Psychiatric: +depression, insomnia, anxiety Endocrine: No palpitations, fatigue, diaphoresis, mood swings, change in appetite, change in weight, increased thirst Hematologic/Lymphatic:  No anemia, purpura, petechiae. Allergic/Immunologic: no itchy/runny eyes, nasal congestion, recent allergic reactions, rashes  PHYSICAL EXAM: Filed  Vitals:   02/03/15 1133  BP: 114/88  Pulse: 100  Resp: 16   General: No acute distress Head:  Normocephalic/atraumatic Neck: supple, no paraspinal tenderness, full range of motion Heart:  Regular rate and rhythm Lungs:  Clear to auscultation bilaterally Back: No paraspinal tenderness Skin/Extremities: No rash, no edema Neurological Exam: alert and oriented to person, place, and time. No aphasia or dysarthria. Fund of knowledge is appropriate.  Remote memory intact. 1/3 delayed recall.  Attention and concentration are normal.    Able to name objects and repeat phrases. Cranial nerves: Pupils equal, round, reactive to light.  Fundoscopic  exam unremarkable, no papilledema. Extraocular movements intact with no nystagmus. Visual fields full. Facial sensation intact. No facial asymmetry. Tongue, uvula, palate midline.  Motor: Bulk and tone normal, muscle strength 5/5 throughout with no pronator drift.  Sensation to light touch intact.  No extinction to double simultaneous stimulation.  Deep tendon reflexes 2+ throughout, toes downgoing.  Finger to nose testing intact.  Gait narrow-based and steady, able to tandem walk adequately.  Romberg negative.  IMPRESSION: This is a 59 yo RH woman with a history of seizures since childhood with episodes of staring and unresponsiveness with head twitch to the right, and infrequent generalized convulsions, none in at least 20 years. She had presented with worsening memory. 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 MRI brain unremarkable. She continues to report improvement in seizures with increase in Lamictal dose.  Lamictal and Depakote levels will be checked today, with plan to increase Lamictal dose after levels reviewed. She will continue current doses for now of Lamictal XR 100mg /day, Depakote ER 1000mg /day and Tegretol XR 800mg /day. Continue follow-up with psychiatry. She does not drive and understands [place] driving laws. Continue to work with her psychiatrist. She will follow-up in 3 months.  Thank you for allowing me to participate in her care.  Please do not hesitate to call for any questions or concerns.  The duration of this appointment visit was 15 minutes of face-to-face time with the patient.  Greater than 50% of this time was spent in counseling, explanation of diagnosis, planning of further management, and coordination of care.   Ellouise Newer, M.D.   CC: Dr.  Doreene Burke

## 2015-02-03 NOTE — Patient Instructions (Addendum)
1. Bloodwork for Lamictal and Depakote levels 2. Continue same doses of medications for now 3. We will plan to increase Lamictal dose after we get bloodwork 4. Keep a calendar of your seizures 5. Follow-up in 3 months

## 2015-02-07 LAB — VALPROIC ACID LEVEL: Valproic Acid Lvl: 50.1 ug/mL (ref 50.0–100.0)

## 2015-02-09 LAB — LAMOTRIGINE LEVEL: Lamotrigine Lvl: 3.9 ug/mL — ABNORMAL LOW (ref 4.0–18.0)

## 2015-02-10 ENCOUNTER — Telehealth: Payer: Self-pay

## 2015-02-10 ENCOUNTER — Telehealth: Payer: Self-pay | Admitting: General Practice

## 2015-02-10 NOTE — Telephone Encounter (Signed)
Patient would like to speak to clinical staff in regards to some symptoms she

## 2015-02-10 NOTE — Telephone Encounter (Signed)
-----   Message from Tresa Garter, MD sent at 02/07/2015  6:17 PM EDT ----- Please inform patient that her laboratory test results are all normal. Mold profile is negative.

## 2015-02-10 NOTE — Telephone Encounter (Signed)
Patient is aware of her lab results 

## 2015-02-11 ENCOUNTER — Telehealth: Payer: Self-pay | Admitting: Family Medicine

## 2015-02-11 NOTE — Telephone Encounter (Signed)
I called patient to let her know her results & instructions for new Rx. She asked that I the [facility] and call in the new Rx. She is on an assistance program with them for her medications. I called the pharmacy and spoke with [facility] & gave her Rx with refills over the phone. I did call patient back to let her know that this had been done.

## 2015-02-11 NOTE — Telephone Encounter (Signed)
-----   Message from Cameron Sprang, MD sent at 02/10/2015  8:55 AM EDT ----- Pls let her know there is a lot of room to increase the Lamictal. Increase to Lamictal XR 150mg , she will need to take 1 tablet of 100mg  and 1 tablet of 50mg  dose. Pls send Rx for 1 month with 3 refills. Thanks

## 2015-02-17 ENCOUNTER — Telehealth: Payer: Self-pay

## 2015-02-17 NOTE — Telephone Encounter (Signed)
Patient is aware of her x ray results 

## 2015-02-17 NOTE — Telephone Encounter (Signed)
-----   Message from Tresa Garter, MD sent at 02/07/2015  6:13 PM EDT ----- Please inform patient that her chest x-ray is normal.

## 2015-03-05 ENCOUNTER — Encounter: Payer: Self-pay | Admitting: Sports Medicine

## 2015-03-05 ENCOUNTER — Ambulatory Visit (INDEPENDENT_AMBULATORY_CARE_PROVIDER_SITE_OTHER): Payer: Self-pay | Admitting: Sports Medicine

## 2015-03-05 VITALS — BP 109/67 | HR 85 | Ht 62.0 in | Wt 221.0 lb

## 2015-03-05 DIAGNOSIS — M25561 Pain in right knee: Secondary | ICD-10-CM

## 2015-03-05 DIAGNOSIS — M1711 Unilateral primary osteoarthritis, right knee: Secondary | ICD-10-CM

## 2015-03-05 DIAGNOSIS — G8929 Other chronic pain: Secondary | ICD-10-CM

## 2015-03-05 MED ORDER — METHYLPREDNISOLONE ACETATE 40 MG/ML IJ SUSP
40.0000 mg | Freq: Once | INTRAMUSCULAR | Status: AC
  Administered 2015-03-05: 40 mg via INTRA_ARTICULAR

## 2015-03-06 DIAGNOSIS — G8929 Other chronic pain: Secondary | ICD-10-CM | POA: Insufficient documentation

## 2015-03-06 DIAGNOSIS — M25561 Pain in right knee: Secondary | ICD-10-CM

## 2015-03-06 NOTE — Progress Notes (Signed)
Amanda Roach - 59 y.o. female MRN [id number]  Date of birth: 01/02/1956  SUBJECTIVE: CC: 1.  right knee pain     HPI:   patient reports having worsening right knee pain for greater than 5 months. She reports falling multiple times, one time at work and weight 2014.  She has been following subsequently multiple times per week due to her right knee giving way on her. She denies any clicking, locking but has significant pain with hip flexion as well as with knee extension. Difficulty standing on one leg.  Pain does not radiate to her groin or down to her foot and is diffusely located &   Strike taking anti-inflammatories, pain medications without significant improvement.  Has been seen previously by orthopedics but these records are not available. She's had x-rays as below. Felt that she had a degenerative meniscal tear and this would improve.  Has not performed any physical therapy or therapeutic exercises      ROS:  per HPI   She does have a seizure disorder as well but reports these episodes are different than her typical seizure episodes.    HISTORY:  Past Medical, Surgical, Social, and Family History reviewed & updated per EMR.  Pertinent Historical Findings include: Social History   Occupational History  . Not on file.   Social History Main Topics  . Smoking status: Never Smoker   . Smokeless tobacco: Not on file  . Alcohol Use: No  . Drug Use: No  . Sexual Activity: Not on file    No specialty comments available. Problem  Knee Pain, Right, Chronic   Plain film x-rays of right knee: 04/12/2013: Nonweightbearing films - chronic Mild tricompartmental degenerative changes without evidence of fracture dislocation  Ultrasound evidence of potential quadriceps tear and associated quad extensor weakness      OBJECTIVE:  VS:   HT:5\' 2"  (157.5 cm)   WT:221 lb (100.245 kg)  BMI:40.5          BP:109/67 mmHg  HR:85bpm  TEMP: ( )  RESP:   PHYSICAL EXAM: GENERAL: Adult  obese African-American female no acute distress she is alert and appropriate lead interactive. PSYCH: Alert and appropriately interactive. SKIN: No open skin lesions or abnormal skin markings on areas inspected as below VASCULAR: DP and PT pulses 2+/4 bilaterally. No significant pretibial edema. NEURO: Lower extremity strength is 5+/5 in all myotomes; sensation is intact to light touch in all dermatomes. RIGHT KNEE: Overall well aligned. She is obese. No significant deformity. No significant pain with internal or external rotation of the hip. No pain over the greater trochanter. She does have pain over the superior patellar pole. Minimal medial lateral jointline tenderness. No significant knee effusion. She is stable to varus valgus strain, anterior/posterior drawer. Lachman's is stable. Minimal pain with McMurray's. Significant pain and weakness this with straight leg raise localizes to the medial aspect of the knee.  LIMITED MSK ULTRASOUND OF RIGHT KNEE: Findings:  No significant effusion. Patellar tendon is intact. Medial and lateral joint lines narrowing. No significant meniscal extrusion. Quadricep tendon over the VMO does have an area of neovascularity as well as fiber disruption that appears chronic. No significant hypoechoic change. Impression: Likely VMO quad strain with questionable degenerative changes of medial lateral meniscus   ASSESSMENT & PLAN: See problem based charting & AVS for additional documentation Problem List Items Addressed This Visit    Knee Pain, Right, Chronic (Chronic)    PROCEDURE NOTE : Right Knee intra-articular Injection After discussing  the risks, benefits and expected outcomes of the injection and all questions were reviewed and answered,  she wished to undergo the above named procedure.  Written consent was obtained. After an appropriate time out was taken the right knee was sterilely prepped and injected as below: Prep:    Betadine and alcohol,  Ethel chloride.    Approach:  Anterior medial, Bent knee Needle:  22-gauge 1-1/2 inch  Meds:   3 mL 1% lidocaine, 1 mL 40 mg Depo-Medrol A bandaid was applied to the area. This procedure was well tolerated and there were no complications.    Injection today, quad sets prescribed. Patient should begin with physical therapy however limited due to financial status and lack of insurance. We'll see how she is doing at follow-up. If any significant worsening consider MRI of the knee to evaluate for potential degenerative meniscal tear however suspect this is coming from VMO strain/tear that is slow to resolve without appropriate rehabilitation.      Osteoarthritis of right knee - Primary   Relevant Medications   methylPREDNISolone acetate (DEPO-MEDROL) injection 40 mg (Completed)     FOLLOW UP:  Return in about 3 weeks (around 03/26/2015) for re-eval and for PT referal vs consideratio of MRI.

## 2015-03-06 NOTE — Assessment & Plan Note (Signed)
PROCEDURE NOTE : Right Knee intra-articular Injection After discussing the risks, benefits and expected outcomes of the injection and all questions were reviewed and answered,  she wished to undergo the above named procedure.  Written consent was obtained. After an appropriate time out was taken the right knee was sterilely prepped and injected as below: Prep:    Betadine and alcohol,  Ethel chloride.  Approach:  Anterior medial, Bent knee Needle:  22-gauge 1-1/2 inch  Meds:   3 mL 1% lidocaine, 1 mL 40 mg Depo-Medrol A bandaid was applied to the area. This procedure was well tolerated and there were no complications.    Injection today, quad sets prescribed. Patient should begin with physical therapy however limited due to financial status and lack of insurance. We'll see how she is doing at follow-up. If any significant worsening consider MRI of the knee to evaluate for potential degenerative meniscal tear however suspect this is coming from VMO strain/tear that is slow to resolve without appropriate rehabilitation.

## 2015-03-10 ENCOUNTER — Encounter: Payer: Self-pay | Admitting: Internal Medicine

## 2015-03-20 ENCOUNTER — Other Ambulatory Visit: Payer: Self-pay

## 2015-03-20 NOTE — Telephone Encounter (Signed)
Faxed refill request to [facility] Dept for Benicar Hct 20/12.5, quantity 90, no refills as this is a 3 month supply. Wrote note on fax stating "Patient will need appointment for additional refills".

## 2015-03-26 ENCOUNTER — Encounter: Payer: Self-pay | Admitting: Sports Medicine

## 2015-03-26 ENCOUNTER — Ambulatory Visit (INDEPENDENT_AMBULATORY_CARE_PROVIDER_SITE_OTHER): Payer: Self-pay | Admitting: Sports Medicine

## 2015-03-26 VITALS — BP 110/66 | HR 79 | Ht 61.0 in | Wt 225.0 lb

## 2015-03-26 DIAGNOSIS — G8929 Other chronic pain: Secondary | ICD-10-CM

## 2015-03-26 DIAGNOSIS — M25561 Pain in right knee: Secondary | ICD-10-CM

## 2015-03-26 DIAGNOSIS — M1711 Unilateral primary osteoarthritis, right knee: Secondary | ICD-10-CM

## 2015-03-26 MED ORDER — NITROGLYCERIN 0.2 MG/HR TD PT24: MEDICATED_PATCH | TRANSDERMAL | Status: DC

## 2015-03-26 NOTE — Patient Instructions (Signed)

## 2015-03-27 NOTE — Progress Notes (Signed)
Amanda Roach - 59 y.o. female MRN [id number]  Date of birth: 1955/12/19  SUBJECTIVE: CC: 1.  right knee pain, follow up     HPI:  interval history: Significant improvement with injection. She is performing her exercises on an infrequent basis.    patient reports having worsening right knee pain for greater than 5 months. She reports falling multiple times, one time at work and weight 2014.  She has been following subsequently multiple times per week due to her right knee giving way on her. She denies any clicking, locking but has significant pain with hip flexion as well as with knee extension. Difficulty standing on one leg.  Pain does not radiate to her groin or down to her foot and is diffusely located  Strike taking anti-inflammatories, pain medications without significant improvement.  Has been seen previously by orthopedics but these records are not available. She's had x-rays as below. Felt that she had a degenerative meniscal tear and this would improve.  Has not performed any physical therapy or therapeutic exercises      ROS:  per HPI   She does have a seizure disorder as well but reports these episodes are different than her typical seizure episodes.    HISTORY:  Past Medical, Surgical, Social, and Family History reviewed & updated per EMR.  Pertinent Historical Findings include: Social History   Occupational History  . Not on file.   Social History Main Topics  . Smoking status: Never Smoker   . Smokeless tobacco: Not on file  . Alcohol Use: No  . Drug Use: No  . Sexual Activity: Not on file    No specialty comments available. Problem  Knee Pain, Right, Chronic   Plain film x-rays of right knee: 04/12/2013: Nonweightbearing films - chronic Mild tricompartmental degenerative changes without evidence of fracture dislocation  Ultrasound evidence of potential quadriceps tear and associated quad extensor weakness      OBJECTIVE:  VS:   HT:5\' 1"  (154.9 cm)    WT:225 lb (102.059 kg)  BMI:42.6          BP:110/66 mmHg  HR:79bpm  TEMP: ( )  RESP:   PHYSICAL EXAM: GENERAL: Adult obese African-American female no acute distress she is alert and appropriate lead interactive. PSYCH: Alert and appropriately interactive. SKIN: No open skin lesions or abnormal skin markings on areas inspected as below VASCULAR: DP and PT pulses 2+/4 bilaterally. No significant pretibial edema. NEURO: Lower extremity strength is 5+/5 in all myotomes; sensation is intact to light touch in all dermatomes. RIGHT KNEE: Overall well aligned. She is obese. No significant deformity. No significant pain with internal or external rotation of the hip. No pain over the greater trochanter. She does have pain over the superior patellar pole. No significant medial or lateral jointline tenderness. No significant knee effusion. She is stable to varus valgus strain, anterior/posterior drawer. Lachman's is stable. Minimal pain with McMurray's. Significant pain and weakness this with straight leg raise localizes to the medial aspect of the knee.   ASSESSMENT & PLAN: See problem based charting & AVS for additional documentation Problem List Items Addressed This Visit    Knee Pain, Right, Chronic (Chronic)    Exam once again consistent with partial quadriceps tear.  Once again reinforced importance of therapeutic exercises. She is a candidate for physical therapy when she obtains the [facility] card. Can consider MRI further evaluate for meniscal pathology was given suspect partial tear likely secondary to seizure episode. Patient will call when she  has [facility] card will need to be referred to physical therapy. If any worsening or recurrent symptoms MRI of right knee.      Osteoarthritis of right knee - Primary     FOLLOW UP:  No Follow-up on file.

## 2015-03-27 NOTE — Assessment & Plan Note (Signed)
Exam once again consistent with partial quadriceps tear.  Once again reinforced importance of therapeutic exercises. She is a candidate for physical therapy when she obtains the [facility] card. Can consider MRI further evaluate for meniscal pathology was given suspect partial tear likely secondary to seizure episode. Patient will call when she has [facility] card will need to be referred to physical therapy. If any worsening or recurrent symptoms MRI of right knee.

## 2015-04-14 ENCOUNTER — Emergency Department (INDEPENDENT_AMBULATORY_CARE_PROVIDER_SITE_OTHER)
Admission: EM | Admit: 2015-04-14 | Discharge: 2015-04-14 | Disposition: A | Discharge status: Nursing Home (Includes ICF) | Payer: Self-pay | Source: Home / Self Care | Attending: Family Medicine | Admitting: Family Medicine

## 2015-04-14 ENCOUNTER — Encounter (HOSPITAL_COMMUNITY): Payer: Self-pay

## 2015-04-14 ENCOUNTER — Encounter (HOSPITAL_COMMUNITY): Payer: Self-pay | Admitting: Emergency Medicine

## 2015-04-14 ENCOUNTER — Emergency Department (HOSPITAL_COMMUNITY)
Admission: EM | Admit: 2015-04-14 | Discharge: 2015-04-15 | Disposition: A | Discharge status: Home / Self Care | Payer: Self-pay | Attending: Emergency Medicine | Admitting: Emergency Medicine

## 2015-04-14 ENCOUNTER — Emergency Department (HOSPITAL_COMMUNITY): Payer: Self-pay

## 2015-04-14 DIAGNOSIS — I10 Essential (primary) hypertension: Secondary | ICD-10-CM | POA: Insufficient documentation

## 2015-04-14 DIAGNOSIS — F419 Anxiety disorder, unspecified: Secondary | ICD-10-CM | POA: Insufficient documentation

## 2015-04-14 DIAGNOSIS — Z79899 Other long term (current) drug therapy: Secondary | ICD-10-CM | POA: Insufficient documentation

## 2015-04-14 DIAGNOSIS — R630 Anorexia: Secondary | ICD-10-CM | POA: Insufficient documentation

## 2015-04-14 DIAGNOSIS — G40909 Epilepsy, unspecified, not intractable, without status epilepticus: Secondary | ICD-10-CM | POA: Insufficient documentation

## 2015-04-14 DIAGNOSIS — F329 Major depressive disorder, single episode, unspecified: Secondary | ICD-10-CM | POA: Insufficient documentation

## 2015-04-14 DIAGNOSIS — K6289 Other specified diseases of anus and rectum: Secondary | ICD-10-CM | POA: Insufficient documentation

## 2015-04-14 DIAGNOSIS — E785 Hyperlipidemia, unspecified: Secondary | ICD-10-CM | POA: Insufficient documentation

## 2015-04-14 DIAGNOSIS — J45909 Unspecified asthma, uncomplicated: Secondary | ICD-10-CM | POA: Insufficient documentation

## 2015-04-14 DIAGNOSIS — R1032 Left lower quadrant pain: Secondary | ICD-10-CM

## 2015-04-14 LAB — POCT URINALYSIS DIP (DEVICE)
BILIRUBIN URINE: NEGATIVE
Glucose, UA: NEGATIVE mg/dL
Hgb urine dipstick: NEGATIVE
Ketones, ur: NEGATIVE mg/dL
Leukocytes, UA: NEGATIVE
Nitrite: NEGATIVE
Protein, ur: NEGATIVE mg/dL
Specific Gravity, Urine: 1.015 (ref 1.005–1.030)
Urobilinogen, UA: 0.2 mg/dL (ref 0.0–1.0)
pH: 7 (ref 5.0–8.0)

## 2015-04-14 LAB — CBC WITH DIFFERENTIAL/PLATELET
BASOS ABS: 0 10*3/uL (ref 0.0–0.1)
Basophils Relative: 0 % (ref 0–1)
Eosinophils Absolute: 0.2 10*3/uL (ref 0.0–0.7)
Eosinophils Relative: 4 % (ref 0–5)
HCT: 33.7 % — ABNORMAL LOW (ref 36.0–46.0)
HEMOGLOBIN: 10.9 g/dL — AB (ref 12.0–15.0)
Lymphocytes Relative: 38 % (ref 12–46)
Lymphs Abs: 1.8 10*3/uL (ref 0.7–4.0)
MCH: 29.1 pg (ref 26.0–34.0)
MCHC: 32.3 g/dL (ref 30.0–36.0)
MCV: 90.1 fL (ref 78.0–100.0)
MONO ABS: 0.5 10*3/uL (ref 0.1–1.0)
MONOS PCT: 10 % (ref 3–12)
Neutro Abs: 2.2 10*3/uL (ref 1.7–7.7)
Neutrophils Relative %: 48 % (ref 43–77)
PLATELETS: 231 10*3/uL (ref 150–400)
RBC: 3.74 MIL/uL — ABNORMAL LOW (ref 3.87–5.11)
RDW: 14.2 % (ref 11.5–15.5)
WBC: 4.7 10*3/uL (ref 4.0–10.5)

## 2015-04-14 LAB — COMPREHENSIVE METABOLIC PANEL
ALBUMIN: 3.5 g/dL (ref 3.5–5.0)
ALT: 12 U/L — AB (ref 14–54)
AST: 15 U/L (ref 15–41)
Alkaline Phosphatase: 60 U/L (ref 38–126)
Anion gap: 7 (ref 5–15)
BUN: 9 mg/dL (ref 6–20)
CALCIUM: 9.1 mg/dL (ref 8.9–10.3)
CO2: 26 mmol/L (ref 22–32)
CREATININE: 0.97 mg/dL (ref 0.44–1.00)
Chloride: 108 mmol/L (ref 101–111)
GFR calc non Af Amer: >60 mL/min (ref >60)
Glucose, Bld: 108 mg/dL — ABNORMAL HIGH (ref 65–99)
Potassium: 4.2 mmol/L (ref 3.5–5.1)
Sodium: 141 mmol/L (ref 135–145)
Total Bilirubin: 0.3 mg/dL (ref 0.3–1.2)
Total Protein: 6.3 g/dL — ABNORMAL LOW (ref 6.5–8.1)

## 2015-04-14 MED ORDER — IOHEXOL 300 MG/ML  SOLN
25.0000 mL | Freq: Once | INTRAMUSCULAR | Status: AC | PRN
  Administered 2015-04-14: 25 mL via ORAL

## 2015-04-14 MED ORDER — TUCKS 50 % EX PADS: 1.0000 [IU] | MEDICATED_PAD | CUTANEOUS | Status: DC | PRN

## 2015-04-14 MED ORDER — IOHEXOL 300 MG/ML  SOLN
100.0000 mL | Freq: Once | INTRAMUSCULAR | Status: AC | PRN
  Administered 2015-04-14: 100 mL via INTRAVENOUS

## 2015-04-14 MED ORDER — POLYETHYLENE GLYCOL 3350 17 G PO PACK: 17.0000 g | PACK | Freq: Every day | ORAL | Status: DC

## 2015-04-14 NOTE — ED Provider Notes (Signed)
CSN: [id number]     Arrival date & time 04/14/15  1401 History   First MD Initiated Contact with Patient 04/14/15 1916     Chief Complaint  Patient presents with  . Abdominal Pain     (Consider location/radiation/quality/duration/timing/severity/associated sxs/prior Treatment) Patient is a 59 y.o. female presenting with abdominal pain. The history is provided by the patient.  Abdominal Pain Associated symptoms: no chest pain, no constipation, no diarrhea, no shortness of breath and no vomiting    patient with rectal pain and abdominal pain for the last week. Sent from urgent care for presumed diverticulitis. States she has pain with bowel movements. No blood in the stool. No fevers. She's had a mildly decreased appetite. She also states that she has felt she has to go to the bathroom more frequently. Pain is somewhat sharp. No blood in the stool. No trauma.  Past Medical History  Diagnosis Date  . Seizure   . Hypertension   . Knee pain   . Asthma   . Hyperlipidemia   . Seizures   . Depression   . Anxiety    Past Surgical History  Procedure Laterality Date  . Breast surgery      breast reduction  . Esophagogastroduodenoscopy N/A 04/30/2013    Procedure: ESOPHAGOGASTRODUODENOSCOPY (EGD);  Surgeon: Jeryl Columbia, MD;  Location: [facility] ENDOSCOPY;  Service: Endoscopy;  Laterality: N/A;  barb Greggory Brandy   Family History  Problem Relation Age of Onset  . Heart disease Mother    History  Substance Use Topics  . Smoking status: Never Smoker   . Smokeless tobacco: Not on file  . Alcohol Use: No   OB History    No data available     Review of Systems  Constitutional: Negative for activity change and appetite change.  Eyes: Negative for pain.  Respiratory: Negative for chest tightness and shortness of breath.   Cardiovascular: Negative for chest pain and leg swelling.  Gastrointestinal: Positive for abdominal pain. Negative for vomiting, diarrhea, constipation and anal bleeding.   Genitourinary: Negative for flank pain.  Musculoskeletal: Negative for back pain and neck stiffness.  Skin: Negative for rash.  Neurological: Negative for weakness, numbness and headaches.  Psychiatric/Behavioral: Negative for behavioral problems.      Allergies  Naproxen  Home Medications   Prior to Admission medications   Medication Sig Start Date End Date Taking? Authorizing Provider  atorvastatin (LIPITOR) 10 MG tablet Take 1 tablet (10 mg total) by mouth daily. 08/13/14  Yes Lance Bosch, NP  carbamazepine (TEGRETOL XR) 400 MG 12 hr tablet Take 2 tabs daily 11/04/14  Yes Cameron Sprang, MD  divalproex (DEPAKOTE ER) 500 MG 24 hr tablet Take 2 tabs daily 11/04/14  Yes Cameron Sprang, MD  esomeprazole (NEXIUM) 40 MG capsule Take 1 capsule (40 mg total) by mouth daily before breakfast. 09/26/14  Yes Tresa Garter, MD  lamoTRIgine (LAMICTAL) 100 MG tablet Take 1 tablet (100 mg total) by mouth daily. 11/13/14  Yes Cameron Sprang, MD  olmesartan-hydrochlorothiazide (BENICAR HCT) 20-12.5 MG per tablet Take 1 tablet by mouth daily. 07/01/14  Yes Lance Bosch, NP  QUEtiapine (SEROQUEL) 400 MG tablet Take 400 mg by mouth at bedtime.   Yes Historical Provider, MD  polyethylene glycol (MIRALAX / GLYCOLAX) packet Take 17 g by mouth daily. 04/14/15   Davonna Belling, MD  Witch Hazel (TUCKS) 50 % PADS Apply 1 Units topically every 4 (four) hours as needed. 04/14/15   Davonna Belling, MD  BP 138/71 mmHg  Pulse 70  Temp(Src) 97.3 F (36.3 C) (Oral)  Resp 19  Ht 5\' 1"  (1.549 m)  Wt 225 lb (102.059 kg)  BMI 42.54 kg/m2  SpO2 98% Physical Exam  Constitutional: She appears well-developed.  HENT:  Head: Atraumatic.  Cardiovascular: Normal rate.   Abdominal: There is tenderness.  Suprapubic tenderness without rebound or guarding.  Genitourinary:  No stool in the rectal fold. Some tenderness on rectal exam uses fingers anteriorly.  Musculoskeletal: Normal range of motion.   Neurological: She is alert.    ED Course  Procedures (including critical care time) Labs Review Labs Reviewed  CBC WITH DIFFERENTIAL/PLATELET - Abnormal; Notable for the following:    RBC 3.74 (*)    Hemoglobin 10.9 (*)    HCT 33.7 (*)    All other components within normal limits  COMPREHENSIVE METABOLIC PANEL - Abnormal; Notable for the following:    Glucose, Bld 108 (*)    Total Protein 6.3 (*)    ALT 12 (*)    All other components within normal limits    Imaging Review Ct Abdomen Pelvis W Contrast  04/14/2015   CLINICAL DATA:  Mid abdominal pain for 1 week. Groin pain. Rectal pain.  EXAM: CT ABDOMEN AND PELVIS WITH CONTRAST  TECHNIQUE: Multidetector CT imaging of the abdomen and pelvis was performed using the standard protocol following bolus administration of intravenous contrast.  CONTRAST:  148mL OMNIPAQUE IOHEXOL 300 MG/ML  SOLN  COMPARISON:  CT abdomen and pelvis 05/04/2013.  FINDINGS: BODY WALL: Unremarkable.  LOWER CHEST: Unremarkable.  ABDOMEN/PELVIS:  Liver: No focal abnormality.  Biliary: No evidence of biliary obstruction or stone.  Pancreas: Unremarkable.  Spleen: Unremarkable.  Adrenals: Unremarkable.  Kidneys and ureters: No hydronephrosis or stone. Unchanged renal cysts, the largest affecting the RIGHT kidney, 4.6 cm diameter.  Bladder: Unremarkable.  Reproductive: Multiple fibroids are stable.  Bowel: No obstruction. Normal appendix. Mild diverticulosis particularly sigmoid colon. No surrounding inflammatory change.  Retroperitoneum: No mass or adenopathy.  Peritoneum: No free fluid or gas.  Vascular: No acute abnormality.  OSSEOUS: No acute abnormalities.  IMPRESSION: Stable exam.  No acute intra-abdominal findings.   Electronically Signed   By: Rolla Flatten M.D.   On: 04/14/2015 21:02     EKG Interpretation None      MDM   Final diagnoses:  Rectal pain    Patient with some rectal pain. Could be anal fissure but none clearly seen. Will discharge home with Tucks  pad and bowel protocol follow-up with her PCP.    Davonna Belling, MD 04/15/15 [phone]

## 2015-04-14 NOTE — ED Notes (Signed)
MD at bedside. 

## 2015-04-14 NOTE — ED Notes (Signed)
In restroom 

## 2015-04-14 NOTE — ED Notes (Signed)
Reports pain low abdomen, perineum, and rectal pain.  Last bm was today and normal, but very painful.  Denies vaginal discharge, but reports yellow coloration on tissue.  Patient reports intermittent pain with urination.  In general pain for one week.

## 2015-04-14 NOTE — ED Notes (Signed)
Pt sent from [facility] for abd pain since last week. The pain has gone down into her pelvic area and also has hurt in her rectum at times. Had a very painful BM today. No vaginal discharge or bleeding. Does report some yellow coloration on the tissue paper. Also some painful urination at times.

## 2015-04-14 NOTE — Discharge Instructions (Signed)
Anal Fissure, Adult An anal fissure is a small tear or crack in the skin around the anus. Bleeding from a fissure usually stops on its own within a few minutes. However, bleeding will often reoccur with each bowel movement until the crack heals.  CAUSES   Passing large, hard stools.  Frequent diarrheal stools.  Constipation.  Inflammatory bowel disease (Crohn's disease or ulcerative colitis).  Infections.  Anal sex. SYMPTOMS   Small amounts of blood seen on your stools, on toilet paper, or in the toilet after a bowel movement.  Rectal bleeding.  Painful bowel movements.  Itching or irritation around the anus. DIAGNOSIS Your caregiver will examine the anal area. An anal fissure can usually be seen with careful inspection. A rectal exam may be performed and a short tube (anoscope) may be used to examine the anal canal. TREATMENT   You may be instructed to take fiber supplements. These supplements can soften your stool to help make bowel movements easier.  Sitz baths may be recommended to help heal the tear. Do not use soap in the sitz baths.  A medicated cream or ointment may be prescribed to lessen discomfort. HOME CARE INSTRUCTIONS   Maintain a diet high in fruits, whole grains, and vegetables. Avoid constipating foods like bananas and dairy products.  Take sitz baths as directed by your caregiver.  Drink enough fluids to keep your urine clear or pale yellow.  Only take over-the-counter or prescription medicines for pain, discomfort, or fever as directed by your caregiver. Do not take aspirin as this may increase bleeding.  Do not use ointments containing numbing medications (anesthetics) or hydrocortisone. They could slow healing. SEEK MEDICAL CARE IF:   Your fissure is not completely healed within 3 days.  You have further bleeding.  You have a fever.  You have diarrhea mixed with blood.  You have pain.  Your problem is getting worse rather than  better. MAKE SURE YOU:   Understand these instructions.  Will watch your condition.  Will get help right away if you are not doing well or get worse. Document Released: 10/25/2005 Document Revised: 01/17/2012 Document Reviewed: 04/11/2011 [facility] Patient Information 2015 [place], [place]. This information is not intended to replace advice given to you by your health care provider. Make sure you discuss any questions you have with your health care provider.

## 2015-04-14 NOTE — ED Provider Notes (Signed)
CSN: [id number]     Arrival date & time 04/14/15  1300 History   First MD Initiated Contact with Patient 04/14/15 1309     Chief Complaint  Patient presents with  . Abdominal Pain   (Consider location/radiation/quality/duration/timing/severity/associated sxs/prior Treatment) Patient is a 59 y.o. female presenting with abdominal pain. The history is provided by the patient.  Abdominal Pain Pain location:  Suprapubic Pain quality: cramping   Pain radiates to:  Epigastric region and periumbilical region Pain severity:  Moderate Duration:  1 week Progression:  Worsening Chronicity:  New Relieved by:  None tried Worsened by:  Bowel movements Associated symptoms: nausea   Associated symptoms: no constipation, no diarrhea, no fever and no vomiting   Risk factors comment:  H/o divertic   Past Medical History  Diagnosis Date  . Seizure   . Hypertension   . Knee pain   . Asthma   . Hyperlipidemia   . Seizures   . Depression   . Anxiety    Past Surgical History  Procedure Laterality Date  . Breast surgery      breast reduction  . Esophagogastroduodenoscopy N/A 04/30/2013    Procedure: ESOPHAGOGASTRODUODENOSCOPY (EGD);  Surgeon: Jeryl Columbia, MD;  Location: [facility] ENDOSCOPY;  Service: Endoscopy;  Laterality: N/A;  barb Greggory Brandy   Family History  Problem Relation Age of Onset  . Heart disease Mother    History  Substance Use Topics  . Smoking status: Never Smoker   . Smokeless tobacco: Not on file  . Alcohol Use: No   OB History    No data available     Review of Systems  Constitutional: Negative for fever.  Gastrointestinal: Positive for nausea, abdominal pain and rectal pain. Negative for vomiting, diarrhea, constipation and blood in stool.  Genitourinary: Positive for pelvic pain.    Allergies  Naproxen  Home Medications   Prior to Admission medications   Medication Sig Start Date End Date Taking? Authorizing Provider  atorvastatin (LIPITOR) 10 MG tablet Take 1 tablet  (10 mg total) by mouth daily. 08/13/14   Lance Bosch, NP  carbamazepine (TEGRETOL XR) 400 MG 12 hr tablet Take 2 tabs daily 11/04/14   Cameron Sprang, MD  divalproex (DEPAKOTE ER) 500 MG 24 hr tablet Take 2 tabs daily 11/04/14   Cameron Sprang, MD  esomeprazole (NEXIUM) 40 MG capsule Take 1 capsule (40 mg total) by mouth daily before breakfast. 09/26/14   Tresa Garter, MD  lamoTRIgine (LAMICTAL) 100 MG tablet Take 1 tablet (100 mg total) by mouth daily. 11/13/14   Cameron Sprang, MD  nitroGLYCERIN (NITRODUR - DOSED IN MG/24 HR) 0.2 mg/hr patch Place 1/4 patch to right knee daily. 03/26/15   Gerda Diss, DO  olmesartan-hydrochlorothiazide (BENICAR HCT) 20-12.5 MG per tablet Take 1 tablet by mouth daily. 07/01/14   Lance Bosch, NP  QUEtiapine (SEROQUEL) 400 MG tablet Take 400 mg by mouth at bedtime.    Historical Provider, MD   BP 151/101 mmHg  Pulse 74  Temp(Src) 98.4 F (36.9 C) (Oral)  Resp 16  SpO2 98% Physical Exam  Constitutional: She is oriented to person, place, and time. She appears well-developed and well-nourished. She appears distressed.  Abdominal: Soft. Bowel sounds are normal. She exhibits no distension and no mass. There is generalized tenderness. There is no rigidity, no rebound, no guarding, no tenderness at McBurney's point and negative Murphy's sign.    Neurological: She is alert and oriented to person, place, and time.  Skin: Skin is warm and dry.  Nursing note and vitals reviewed.   ED Course  Procedures (including critical care time) Labs Review Labs Reviewed  POCT URINALYSIS DIP (DEVICE)   U/a neg. Imaging Review No results found.   MDM   1. Abdominal pain, acute, left lower quadrant    Sent for eval of llq pain with tenesmus, u/a neg, sx for 1 wk getting worse., likely divert.   Billy Fischer, MD 04/14/15 [phone]

## 2015-04-15 NOTE — ED Notes (Signed)
Called security to escort patient to urgent care where her care is.

## 2015-04-21 ENCOUNTER — Ambulatory Visit: Payer: Self-pay | Attending: Internal Medicine | Admitting: Family Medicine

## 2015-04-21 VITALS — BP 126/84 | HR 79 | Temp 98.1°F | Resp 18 | Ht 62.0 in | Wt 209.6 lb

## 2015-04-21 DIAGNOSIS — R1030 Lower abdominal pain, unspecified: Secondary | ICD-10-CM

## 2015-04-21 MED ORDER — DICYCLOMINE HCL 10 MG PO CAPS: 10.0000 mg | ORAL_CAPSULE | Freq: Three times a day (TID) | ORAL | Status: DC

## 2015-04-21 MED ORDER — ESOMEPRAZOLE MAGNESIUM 40 MG PO CPDR: 40.0000 mg | DELAYED_RELEASE_CAPSULE | Freq: Every day | ORAL | Status: DC

## 2015-04-21 NOTE — Patient Instructions (Signed)
Take medications as prescribed Keep a record of your symptoms and bring when you return in two weeks to see me or Dr. Doreene Burke. Get your  Financial aid card as soon as possible so that we can refer to GI.

## 2015-04-21 NOTE — Progress Notes (Signed)
Subjective:     Patient ID: Amanda Roach, female   DOB: 1956/03/06, 59 y.o.   MRN: [id number]  HPI   Patient with a history of abdominal pain of a chronic nature presents for a ED follow-up. This has been an ongoing problem with no definitive diagnosis. She has been evaluated by GI in 2014 and a CT scan show no intraabdominal issues. She also reports some lower neck and upper chest discomfort at times and reports when she tries to eat if feels as if food is getting stuck. She is unable to give me a good description of how the pain feels.She reports no aggragativing or aleviating factors. She has been on omeprozole in the past and has a history of gastritis. She denies ever being treated for IBS. There is a constipation/diarrehea component to her discomfort but she is unable to provide be any pattern of BMS or pain.   Review of Systems   As in HPI     Objective:   Physical Exam   Alert, oriented, appropriate. Skin warm and dry Lungs are clear to auscultation HS are regular Abdomen is soft with normoactive BS. There is a generalized tendernes over the lower abdomen.     Assessment/Plan     Abdominal Pain, consider IBS - Bentyl 20 mg, one po qid before meals and at bedtime., #30 with no refills (I want to see if her symptoms improve with the Bentyl and return after completing the week.) Probable Reflux -trial of omeperozole 20 mg, one po q day, #30 with no refills  I have asked her to keep a symptom diary and follow-up with me or Dr. Doreene Burke in 2 weeks         Luster Landsberg

## 2015-04-21 NOTE — Progress Notes (Signed)
Patient here for throat pain and hospital follow up for abdominal pain. Patient reports feeling okay today. Patient went to the ED within the past week for stomach pain. Patient reports that after she left the hospital she became completely hoarse. Patient reports that after she eats, she feels like something is stuck in her throat and the sensation radiates to her neck, back and ear. Patient reports pain in right lower abdomen at a rating of 5. Patient reports the pain as sore.

## 2015-05-06 ENCOUNTER — Encounter: Payer: Self-pay | Admitting: Neurology

## 2015-05-06 ENCOUNTER — Telehealth: Payer: Self-pay | Admitting: Neurology

## 2015-05-06 ENCOUNTER — Ambulatory Visit (INDEPENDENT_AMBULATORY_CARE_PROVIDER_SITE_OTHER): Payer: Self-pay | Admitting: Neurology

## 2015-05-06 VITALS — BP 96/70 | HR 91 | Resp 16 | Ht 62.0 in | Wt 218.0 lb

## 2015-05-06 DIAGNOSIS — G40319 Generalized idiopathic epilepsy and epileptic syndromes, intractable, without status epilepticus: Secondary | ICD-10-CM

## 2015-05-06 DIAGNOSIS — R413 Other amnesia: Secondary | ICD-10-CM

## 2015-05-06 NOTE — Progress Notes (Signed)
NEUROLOGY FOLLOW UP OFFICE NOTE  GENNIE EISINGER [id number]  HISTORY OF PRESENT ILLNESS: I had the pleasure of seeing Wafaa Deemer in follow-up in the neurology clinic on 05/06/2015.  The patient was last seen 3 months ago for seizures and memory loss. EEG consistent with primary generalized epilepsy with frequent 1-5 second bursts of generalized discharges. On her last visit, Lamictal XR dose was increased to 150mg /day. She continues on Depakote ER 1000mg /day and Tegretol XR 800mg /day. She is happy to report that the seizures have significantly decreased in frequency. In the past she was reporting multiple day and night time seizures (1-3 daytime, 1-5 nighttime). With increase in medication, she has only had 7 seizures in the past 3 months. The last seizure was 1-2 weeks. Ago. Seizures consist of staring and unresponsiveness. She denies any convulsions. No falls. She denies any side effects with her medications, no dizziness, diplopia, focal numbness/tingling/weakness. She has a frontal pressure-like headache today, no nausea/vomiting, usually triggered by stress. She reports her mood is better, she has distanced herself from her sister and feels this helps her well-being. She continues to see her psychiatrist, counselor, as well as [employer], [employer].   02/06/15: Lamictal level 3.9, Depakote level 50.1  HPI: This is a 59 yo RH woman with a history of hypertension, depression, and seizures since age 28 or 51, presenting for worsening memory and continued seizures. She and her sister report seizures started at age 83 or 54, she has "petit" ones and generalized convulsions. The "petit" seizures consist of staring and unresponsiveness with slight shaking of the head lasting 1 minute, sometimes occurring in clusters of 3 ro 4 within a 3 hour interval. They deny any lip smacking, dystonic posturing, or automatisms. She has had urinary incontinence and some tongue bite with these. She knows when she is having one, she  feels her head turning to the right but cannot control it, loses focus, forgetting what she was doing, followed by generalized weakness. She lives alone and reports having the petit ones 2-3 times daily. She denies any generalized convulsions in at least 20 years. Her sister describes right head version with some of the convulsions. She denies any gustatory hallucinations, deja vu, rising epigastric sensation, focal numbness/tingling/weakness. She has had body jerks since childhood. She has told her sister she has smelled "poop" several times.   She and her sister report that she has always had memory problems as a child, but this has greatly increased in the past 20 years. She would say something that didn't happen, or forget things she was supposed to do. Her sister is concerned about how she had given her phone number to strangers and could not recall if she had given them the wrong number. She lives alone but family has stayed in contact with her daily. She would get paranoid if they do not call her. She has been working with her therapist on this, and has a schedule book. She reports that her mood is really good some days, but other days "it's just no good," she sits and is "totally depressed." She can get very angry and violent at times. She denies any suicidal ideation. She takes Seroquel at bedtime.  Epilepsy Risk Factors: Her father and 2 sisters had seizures in childhood. Otherwise she had a normal birth and early development. There is no history of febrile convulsions, CNS infections such as meningitis/encephalitis, significant traumatic brain injury, neurosurgical procedures  Prior AEDs: She recalls trying Dilantin and Phenobarbital when younger.  Diagnostic Data: Routine EEG was abnormal with occasional focal slowing over the left posterior temporal region, as well as frequent bursts of generalized high voltage irregular 4 Hz spike and wave discharges with frontal predominance lasting 1-4  seconds predominantly in drowsiness and sleep, consistent with a primary generalized epilepsy.  48-hour EEG showed similar occasional focal slowing over the left temporal region, and frequent generalized 4-5 Hz spike and polyspike and wave discharges lasting 1-5 seconds. There was shifting lead-in noted over the left and right frontal regions, raising the possibility of secondary bisynchrony. She did not write her seizures down in the diary, but reports she had a lot. It is still unclear if the epileptiform discharges lasting more than 3 seconds are symptomatic as she did not write down the times of her seizures. There were no prolonged discharges lasting more than 10 seconds seen.  MRI brain with and without contrast 09/2014 did not show any acute changes, hippocampi symmetric with no abnormal signal or enhancement. There was mild diffuse atrophy, mild chronic microvascular changes noted.   Last levels: 02/06/15 Lamictal 3.9 (on Lamictal 100mg /day), Depakote 50.1  PAST MEDICAL HISTORY: Past Medical History  Diagnosis Date  . Seizure   . Hypertension   . Knee pain   . Asthma   . Hyperlipidemia   . Seizures   . Depression   . Anxiety     MEDICATIONS: Current Outpatient Prescriptions on File Prior to Visit  Medication Sig Dispense Refill  . atorvastatin (LIPITOR) 10 MG tablet Take 1 tablet (10 mg total) by mouth daily. 90 tablet 3  . carbamazepine (TEGRETOL XR) 400 MG 12 hr tablet Take 2 tabs daily 60 tablet 11  . dicyclomine (BENTYL) 10 MG capsule Take 1 capsule (10 mg total) by mouth 4 (four) times daily -  before meals and at bedtime. 30 capsule 0  . divalproex (DEPAKOTE ER) 500 MG 24 hr tablet Take 2 tabs daily 60 tablet 11  . esomeprazole (NEXIUM) 40 MG capsule Take 1 capsule (40 mg total) by mouth daily before breakfast. 30 capsule 0  . lamoTRIgine (LAMICTAL) 100 MG tablet Take 1 tablet (100 mg total) by mouth daily. 7 tablet 0  . olmesartan-hydrochlorothiazide (BENICAR HCT)  20-12.5 MG per tablet Take 1 tablet by mouth daily. 30 tablet 4  . QUEtiapine (SEROQUEL) 400 MG tablet Take 400 mg by mouth at bedtime.    Addison Lank Hazel (TUCKS) 50 % PADS Apply 1 Units topically every 4 (four) hours as needed. 40 each 0  Lamictal XR 50mg  daily (taken with Lamictal 100mg /day) for total of 150mg /day No current facility-administered medications on file prior to visit.    ALLERGIES: Allergies  Allergen Reactions  . Naproxen Nausea Only    FAMILY HISTORY: Family History  Problem Relation Age of Onset  . Heart disease Mother     SOCIAL HISTORY: History   Social History  . Marital Status: Divorced    Spouse Name: N/A  . Number of Children: N/A  . Years of Education: N/A   Occupational History  . Not on file.   Social History Main Topics  . Smoking status: Never Smoker   . Smokeless tobacco: Not on file  . Alcohol Use: No  . Drug Use: No  . Sexual Activity: Not on file   Other Topics Concern  . Not on file   Social History Narrative    REVIEW OF SYSTEMS: Constitutional: No fevers, chills, or sweats, no generalized fatigue, change in appetite Eyes: No visual changes,  double vision, eye pain Ear, nose and throat: No hearing loss, ear pain, nasal congestion, sore throat Cardiovascular: No chest pain, palpitations Respiratory:  No shortness of breath at rest or with exertion, wheezes GastrointestinaI: No nausea, vomiting, diarrhea, abdominal pain, fecal incontinence Genitourinary:  No dysuria, urinary retention or frequency Musculoskeletal:  No neck pain, back pain Integumentary: No rash, pruritus, skin lesions Neurological: as above Psychiatric: +depression, insomnia, anxiety Endocrine: No palpitations, fatigue, diaphoresis, mood swings, change in appetite, change in weight, increased thirst Hematologic/Lymphatic:  No anemia, purpura, petechiae. Allergic/Immunologic: no itchy/runny eyes, nasal congestion, recent allergic reactions, rashes  PHYSICAL  EXAM: Filed Vitals:   05/06/15 1447  BP: 96/70  Pulse: 91  Resp: 16   General: No acute distress Head:  Normocephalic/atraumatic Neck: supple, no paraspinal tenderness, full range of motion Heart:  Regular rate and rhythm Lungs:  Clear to auscultation bilaterally Back: No paraspinal tenderness Skin/Extremities: No rash, no edema Neurological Exam: alert and oriented to person, place, and time. No aphasia or dysarthria. Fund of knowledge is appropriate.  Recent and remote memory are intact. 2/3 delayed recall. Attention and concentration are normal.    Able to name objects and repeat phrases. Cranial nerves: Pupils equal, round, reactive to light.  Fundoscopic exam unremarkable, no papilledema. Extraocular movements intact with no nystagmus. Visual fields full. Facial sensation intact. No facial asymmetry. Tongue, uvula, palate midline.  Motor: Bulk and tone normal, muscle strength 5/5 throughout with no pronator drift.  Sensation to light touch intact.  No extinction to double simultaneous stimulation.  Deep tendon reflexes 2+ throughout, toes downgoing.  Finger to nose testing intact.  Gait narrow-based and steady, able to tandem walk adequately.  Romberg negative.  IMPRESSION: This is a 59 yo RH woman with a history of seizures since childhood with episodes of staring and unresponsiveness with head twitch to the right, and infrequent generalized convulsions, no convulsions in at least 20 years. She had presented with worsening memory and multiple daily seizures. 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 MRI brain unremarkable. She reports significant improvement in seizure frequency with only 7 seizures in the past 3 months on higher dose Lamictal. She takes Depakote ER  1000mg /day and Tegretol XR 800mg /day.She is on Lamotrigine 100mg /day in addition to Lamotrigine ER 50mg /day. Continue all current doses. Continue follow-up with psychiatry. She does not drive and understands [place] driving laws. She will follow-up in 4 months and knows to call our office for any changes.  Thank you for allowing me to participate in her care.  Please do not hesitate to call for any questions or concerns.  The duration of this appointment visit was 15 minutes of face-to-face time with the patient.  Greater than 50% of this time was spent in counseling, explanation of diagnosis, planning of further management, and coordination of care.   Ellouise Newer, M.D.   CC: Dr. Doreene Burke

## 2015-05-06 NOTE — Patient Instructions (Signed)
1. Continue all your medications 2. Call our office to confirm if you are taking extended-release Lamictal, and also if you need refills. 3. Follow-up in 4 months

## 2015-05-06 NOTE — Telephone Encounter (Signed)
Pt called and said that her Lamictal XR 50mg  has 2 refills and her Lamictal 100mg  has 1 refill/Dawn CB# [phone]

## 2015-05-06 NOTE — Telephone Encounter (Signed)
Noted  

## 2015-05-07 ENCOUNTER — Encounter: Payer: Self-pay | Admitting: Sports Medicine

## 2015-05-07 ENCOUNTER — Encounter: Payer: Self-pay | Admitting: Neurology

## 2015-05-07 ENCOUNTER — Ambulatory Visit (INDEPENDENT_AMBULATORY_CARE_PROVIDER_SITE_OTHER): Payer: Self-pay | Admitting: Sports Medicine

## 2015-05-07 VITALS — BP 127/47 | Ht 62.0 in | Wt 209.0 lb

## 2015-05-07 DIAGNOSIS — G8929 Other chronic pain: Secondary | ICD-10-CM

## 2015-05-07 DIAGNOSIS — M1711 Unilateral primary osteoarthritis, right knee: Secondary | ICD-10-CM

## 2015-05-07 DIAGNOSIS — M25561 Pain in right knee: Secondary | ICD-10-CM

## 2015-05-07 NOTE — Progress Notes (Signed)
   Subjective:    Patient ID: Amanda Roach, female    DOB: 01-Mar-1956, 59 y.o.   MRN: [id number]  HPI Chief complaint right knee pain follow-up Subjective: Overall she reports her knee is significantly better. She has not had any giving way since her last office visit.  She has had significantly increased strength has been able to perform straight leg raises on a daily basis in supine position. Still having difficulty in a seated position with these. Pain is overall significantly better upon across the superior aspect of the patella.   SOCIAL HX: Current every day smoker   Review of Systems Denies fevers, chills significant nighttime awakenings otherwise per history of present illness     Objective:   Physical Exam BP 127/47 mmHg  Ht 5\' 2"  (1.575 m)  Wt 209 lb (94.802 kg)  BMI 38.22 kg/m2 Exam right knee overall well aligned positive patellar grind bilaterally. No significant effusion. Stable to varus and valgus strain, anterior posterior drawer. Minimal medial joint line tenderness with negative McMurray's. Patient unable to perform [facility] due to the quadriceps weakness. Tenderness over superior pole of the patella significantly improved since last visit.     Assessment & Plan:   Encounter Diagnoses  Name Primary?  . Primary osteoarthritis of right knee Yes  . Knee Pain, Right, Chronic     Problem  Knee Pain, Right, Chronic   Plain film x-rays of right knee: 04/12/2013: Nonweightbearing films - chronic Mild tricompartmental degenerative changes without evidence of fracture dislocation  Ultrasound evidence of potential quadriceps tear and associated quad extensor weakness - improving     Overall she has continued to show significant improvement.  Prior US evidence of quad injury clinically improving; significant migraine hx (not a nitro candidate). Her range of motion of her right knee is significantly improved and has only a 2 extensor lag improved. She will plan to  continue doing her activities on a daily basis and we will see her back on an as needed basis for this. Did discuss that if she has any worsening weakness, pain or giving way we would see her back to further evaluate with an MRI at that time. Overall patient happy with how she has progressed with her symptoms. Strength is markedly improving. Further intervention/formal therampy still limited by lack of insurance

## 2015-06-12 ENCOUNTER — Ambulatory Visit: Payer: Self-pay | Admitting: Internal Medicine

## 2015-06-19 ENCOUNTER — Other Ambulatory Visit: Payer: Self-pay | Admitting: Family Medicine

## 2015-06-19 ENCOUNTER — Ambulatory Visit: Payer: Self-pay | Attending: Internal Medicine | Admitting: Internal Medicine

## 2015-06-19 ENCOUNTER — Encounter: Payer: Self-pay | Admitting: Internal Medicine

## 2015-06-19 DIAGNOSIS — Z79899 Other long term (current) drug therapy: Secondary | ICD-10-CM | POA: Insufficient documentation

## 2015-06-19 DIAGNOSIS — G40909 Epilepsy, unspecified, not intractable, without status epilepticus: Secondary | ICD-10-CM | POA: Insufficient documentation

## 2015-06-19 DIAGNOSIS — J45909 Unspecified asthma, uncomplicated: Secondary | ICD-10-CM | POA: Insufficient documentation

## 2015-06-19 DIAGNOSIS — R1084 Generalized abdominal pain: Secondary | ICD-10-CM | POA: Insufficient documentation

## 2015-06-19 DIAGNOSIS — G8929 Other chronic pain: Secondary | ICD-10-CM | POA: Insufficient documentation

## 2015-06-19 DIAGNOSIS — I10 Essential (primary) hypertension: Secondary | ICD-10-CM | POA: Insufficient documentation

## 2015-06-19 DIAGNOSIS — E785 Hyperlipidemia, unspecified: Secondary | ICD-10-CM | POA: Insufficient documentation

## 2015-06-19 DIAGNOSIS — F329 Major depressive disorder, single episode, unspecified: Secondary | ICD-10-CM | POA: Insufficient documentation

## 2015-06-19 MED ORDER — LAMOTRIGINE ER 100 MG PO TB24: 100.0000 mg | ORAL_TABLET | Freq: Every day | ORAL | Status: DC

## 2015-06-19 MED ORDER — DICYCLOMINE HCL 10 MG PO CAPS: 20.0000 mg | ORAL_CAPSULE | Freq: Three times a day (TID) | ORAL | Status: DC

## 2015-06-19 NOTE — Progress Notes (Signed)
Patient ID: Amanda Roach, female   DOB: 01-02-1956, 59 y.o.   MRN: [id number]   [name], is a 59 y.o. female  WER:[id number]  [id number]:[id number]  DOB - 1956-02-16  Chief Complaint  Patient presents with  . Abdominal Pain  . Referral        Subjective:   Amanda Roach is a 59 y.o. female here today for a follow up visit. Patient has history of hypertension, seizure disorder, asthma, hyperlipidemia, major depression and chronic pain. Patient here for lower abdomen pain. Patient reports pain today located in lower abdomen rated at a 7 described as cramping and stabbing. Pain comes and goes. Pain started 2014 but stopped and returned about 1-2 months ago. Patient reports that when she eats sugar that it hurts worse. Patient reports the pain moves across her lower stomach. Patient requests referral to a specialist to get it checked out. Patient has taken her medications today and needs refill on lamictal 100mg . Patient has No headache, No chest pain, No Nausea, No new weakness tingling or numbness, No Cough - SOB.  No problems updated.  ALLERGIES: Allergies  Allergen Reactions  . Naproxen Nausea Only    PAST MEDICAL HISTORY: Past Medical History  Diagnosis Date  . Seizure   . Hypertension   . Knee pain   . Asthma   . Hyperlipidemia   . Seizures   . Depression   . Anxiety     MEDICATIONS AT HOME: Prior to Admission medications   Medication Sig Start Date End Date Taking? Authorizing Provider  atorvastatin (LIPITOR) 10 MG tablet Take 1 tablet (10 mg total) by mouth daily. 08/13/14  Yes Lance Bosch, NP  carbamazepine (TEGRETOL XR) 400 MG 12 hr tablet Take 2 tabs daily 11/04/14  Yes Cameron Sprang, MD  divalproex (DEPAKOTE ER) 500 MG 24 hr tablet Take 2 tabs daily 11/04/14  Yes Cameron Sprang, MD  LamoTRIgine (LAMICTAL XR) 100 MG TB24 Take 1 tablet (100 mg total) by mouth daily. 06/19/15  Yes Cameron Sprang, MD  LamoTRIgine (LAMICTAL XR) 50 MG TB24 Take 50 mg by mouth daily.    Yes Historical Provider, MD  olmesartan-hydrochlorothiazide (BENICAR HCT) 20-12.5 MG per tablet Take 1 tablet by mouth daily. 07/01/14  Yes Lance Bosch, NP  QUEtiapine (SEROQUEL) 400 MG tablet Take 400 mg by mouth at bedtime.   Yes Historical Provider, MD  dicyclomine (BENTYL) 10 MG capsule Take 2 capsules (20 mg total) by mouth 3 (three) times daily before meals. 06/19/15   Tresa Garter, MD  esomeprazole (NEXIUM) 40 MG capsule Take 1 capsule (40 mg total) by mouth daily before breakfast. Patient not taking: Reported on 06/19/2015 04/21/15   Micheline Chapman, NP  Witch Hazel (TUCKS) 50 % PADS Apply 1 Units topically every 4 (four) hours as needed. Patient not taking: Reported on 06/19/2015 04/14/15   Davonna Belling, MD     Objective:   Filed Vitals:   06/19/15 1217  BP: 119/81  Pulse: 81  Temp: 98.5 F (36.9 C)  TempSrc: Oral  Resp: 16  Height: 5\' 2"  (1.575 m)  Weight: 205 lb 9.6 oz (93.26 kg)  SpO2: 96%    Exam General appearance : Awake, alert, not in any distress. Speech Clear. Not toxic looking HEENT: Atraumatic and Normocephalic, pupils equally reactive to light and accomodation Neck: supple, no JVD. No cervical lymphadenopathy.  Chest:Good air entry bilaterally, no added sounds  CVS: S1 S2 regular, no murmurs.  Abdomen: Bowel sounds  present, generalized tenderness, mostly peri-umbilical area, not distended with no gaurding, rigidity or rebound. No organ palpably enlarged Extremities: B/L Lower Ext shows no edema, both legs are warm to touch Neurology: Awake alert, and oriented X 3, CN II-XII intact, Non focal Skin:No Rash  Data Review Lab Results  Component Value Date   HGBA1C 5.6 07/01/2014     Assessment & Plan   1. Essential hypertension  - We have discussed target BP range and blood pressure goal - I have advised patient to check BP regularly and to call us back or report to clinic if the numbers are consistently higher than 140/90  - We discussed  the importance of compliance with medical therapy and DASH diet recommended, consequences of uncontrolled hypertension discussed.  - continue current BP medications  2. Generalized abdominal pain  - dicyclomine (BENTYL) 10 MG capsule; Take 2 capsules (20 mg total) by mouth 3 (three) times daily before meals.  Dispense: 60 capsule; Refill: 1 - Ambulatory referral to Gastroenterology  Patient have been counseled extensively about nutrition and exercise  Return in about 6 months (around 12/20/2015) for Follow up HTN, Abdominal Pain.  The patient was given clear instructions to go to ER or return to medical center if symptoms don't improve, worsen or new problems develop. The patient verbalized understanding. The patient was told to call to get lab results if they haven't heard anything in the next week.   This note has been created with Surveyor, quantity. Any transcriptional errors are unintentional.    Angelica Chessman, MD, [place], [place], [place], [place] and [place], [place]   06/19/2015, 12:37 PM

## 2015-06-19 NOTE — Patient Instructions (Signed)

## 2015-06-19 NOTE — Progress Notes (Signed)
Patient here for lower abdomen pain. Patient reports pain today located in lower abdomen rated at a 7 described as cramping and stabbing. Pain comes and goes. Pain started 2014 but stopped and returned about 1-2 months ago. Patient reports that when she eats sugar that it hurts worse. Patient reports the pain moves across her lower stomach. Patient requests referral to a specialist to get it checked out. Patient has taken her medications today and needs refill on lamictal 100mg .

## 2015-06-23 ENCOUNTER — Telehealth: Payer: Self-pay | Admitting: Clinical

## 2015-06-23 NOTE — Telephone Encounter (Signed)
F/u w pt; introduction to [facility] services

## 2015-06-24 ENCOUNTER — Other Ambulatory Visit: Payer: Self-pay | Admitting: Family Medicine

## 2015-06-24 MED ORDER — LAMOTRIGINE ER 50 MG PO TB24: 50.0000 mg | ORAL_TABLET | Freq: Every day | ORAL | Status: DC

## 2015-07-07 ENCOUNTER — Other Ambulatory Visit: Payer: Self-pay | Admitting: Internal Medicine

## 2015-07-07 DIAGNOSIS — I10 Essential (primary) hypertension: Secondary | ICD-10-CM

## 2015-07-07 MED ORDER — OLMESARTAN MEDOXOMIL-HCTZ 20-12.5 MG PO TABS: 1.0000 | ORAL_TABLET | Freq: Every day | ORAL | Status: DC

## 2015-08-28 ENCOUNTER — Other Ambulatory Visit: Payer: Self-pay | Admitting: Internal Medicine

## 2015-09-08 ENCOUNTER — Telehealth: Payer: Self-pay | Admitting: Neurology

## 2015-09-08 ENCOUNTER — Ambulatory Visit (INDEPENDENT_AMBULATORY_CARE_PROVIDER_SITE_OTHER): Payer: Self-pay | Admitting: Neurology

## 2015-09-08 ENCOUNTER — Encounter: Payer: Self-pay | Admitting: Neurology

## 2015-09-08 ENCOUNTER — Other Ambulatory Visit: Payer: Self-pay | Admitting: *Deleted

## 2015-09-08 VITALS — BP 110/90 | HR 79 | Ht 62.0 in | Wt 201.0 lb

## 2015-09-08 DIAGNOSIS — G40319 Generalized idiopathic epilepsy and epileptic syndromes, intractable, without status epilepticus: Secondary | ICD-10-CM

## 2015-09-08 DIAGNOSIS — F411 Generalized anxiety disorder: Secondary | ICD-10-CM | POA: Insufficient documentation

## 2015-09-08 DIAGNOSIS — F32A Depression, unspecified: Secondary | ICD-10-CM | POA: Insufficient documentation

## 2015-09-08 DIAGNOSIS — F329 Major depressive disorder, single episode, unspecified: Secondary | ICD-10-CM

## 2015-09-08 DIAGNOSIS — R413 Other amnesia: Secondary | ICD-10-CM

## 2015-09-08 MED ORDER — ATORVASTATIN CALCIUM 10 MG PO TABS: 10.0000 mg | ORAL_TABLET | Freq: Every day | ORAL | Status: DC

## 2015-09-08 NOTE — Telephone Encounter (Signed)
Noted  

## 2015-09-08 NOTE — Telephone Encounter (Signed)
Dawn from the [employer] [phone]//called to inform that the pt prescription for/ Lamictol/ was approved last week/and she is waiting on shippment//

## 2015-09-08 NOTE — Telephone Encounter (Signed)
Patient came into facility and requested a med refill for atorvastatin (LIPITOR) 10 MG tablet. Please f/u with pt.

## 2015-09-08 NOTE — Telephone Encounter (Signed)
Patients Atorvastatin has been refilled with 3 refills.

## 2015-09-08 NOTE — Progress Notes (Signed)
NEUROLOGY FOLLOW UP OFFICE NOTE  Amanda Roach [id number]  HISTORY OF PRESENT ILLNESS: I had the pleasure of seeing Amanda Roach in follow-up in the neurology clinic on 09/08/2015.  The patient was last seen 4 months ago for seizures and memory loss. EEG consistent with primary generalized epilepsy with frequent 1-5 second bursts of generalized discharges. On her last visit, she had been doing very well with only 7 seizures in the preceding 3 months, however today presents in a down mood, reporting increased stress and feeling down, "like something is sitting on my chest and thumps real hard, can't get a breath." She feels anxious. She had seen her psychiatrist and Seroquel dose was increased to 400mg  qhs due to poor sleep. She will be seeing her counselor this week. She reports that her Lamictal 100mg  prescription has not arrived for 3 weeks now. She still has the Lamictal XR 50mg  tablets. She was taking a total of 150mg /day. She continues to take Depakote ER 1000mg /day and Tegretol XR 800mg /day. With inability to obtain Lamictal, she has been having an increase in seizures. She reports having 4 in the waiting room this morning. No convulsions. She had a fall 3 weeks ago, she thinks her knee gave out and she ended up on the floor, no loss of consciousness but she had to gather her thoughts. The increase in seizures have gotten her down, she feels her memory is worse as well. She lives by herself and does not drive.   02/06/15: Lamictal level 3.9, Depakote level 50.1  HPI: This is a 59 yo RH woman with a history of hypertension, depression, and seizures since age 21 or 59, presenting for worsening memory and continued seizures. She and her sister report seizures started at age 51 or 59, she has "petit" ones and generalized convulsions. The "petit" seizures consist of staring and unresponsiveness with slight shaking of the head lasting 1 minute, sometimes occurring in clusters of 3 ro 4 within a 3 hour  interval. They deny any lip smacking, dystonic posturing, or automatisms. She has had urinary incontinence and some tongue bite with these. She knows when she is having one, she feels her head turning to the right but cannot control it, loses focus, forgetting what she was doing, followed by generalized weakness. She lives alone and reports having the petit ones 2-3 times daily. She denies any generalized convulsions in at least 20 years. Her sister describes right head version with some of the convulsions. She denies any gustatory hallucinations, deja vu, rising epigastric sensation, focal numbness/tingling/weakness. She has had body jerks since childhood. She has told her sister she has smelled "poop" several times.   She and her sister report that she has always had memory problems as a child, but this has greatly increased in the past 20 years. She would say something that didn't happen, or forget things she was supposed to do. Her sister is concerned about how she had given her phone number to strangers and could not recall if she had given them the wrong number. She lives alone but family has stayed in contact with her daily. She would get paranoid if they do not call her. She has been working with her therapist on this, and has a schedule book. She reports that her mood is really good some days, but other days "it's just no good," she sits and is "totally depressed." She can get very angry and violent at times. She denies any suicidal ideation. She takes Seroquel  at bedtime.  Epilepsy Risk Factors: Her father and 2 sisters had seizures in childhood. Otherwise she had a normal birth and early development. There is no history of febrile convulsions, CNS infections such as meningitis/encephalitis, significant traumatic brain injury, neurosurgical procedures  Prior AEDs: She recalls trying Dilantin and Phenobarbital when younger.   Diagnostic Data: Routine EEG was abnormal with occasional focal slowing  over the left posterior temporal region, as well as frequent bursts of generalized high voltage irregular 4 Hz spike and wave discharges with frontal predominance lasting 1-4 seconds predominantly in drowsiness and sleep, consistent with a primary generalized epilepsy.  48-hour EEG showed similar occasional focal slowing over the left temporal region, and frequent generalized 4-5 Hz spike and polyspike and wave discharges lasting 1-5 seconds. There was shifting lead-in noted over the left and right frontal regions, raising the possibility of secondary bisynchrony. She did not write her seizures down in the diary, but reports she had a lot. It is still unclear if the epileptiform discharges lasting more than 3 seconds are symptomatic as she did not write down the times of her seizures. There were no prolonged discharges lasting more than 10 seconds seen.  MRI brain with and without contrast 09/2014 did not show any acute changes, hippocampi symmetric with no abnormal signal or enhancement. There was mild diffuse atrophy, mild chronic microvascular changes noted.   Last levels: 02/06/15 Lamictal 3.9 (on Lamictal 100mg /day), Depakote 50.1  PAST MEDICAL HISTORY: Past Medical History  Diagnosis Date  . Seizure ([place])   . Hypertension   . Knee pain   . Asthma   . Hyperlipidemia   . Seizures ([place])   . Depression   . Anxiety     MEDICATIONS: Current Outpatient Prescriptions on File Prior to Visit  Medication Sig Dispense Refill  . atorvastatin (LIPITOR) 10 MG tablet Take 1 tablet (10 mg total) by mouth daily. 90 tablet 3  . carbamazepine (TEGRETOL XR) 400 MG 12 hr tablet Take 2 tabs daily 60 tablet 11  . divalproex (DEPAKOTE ER) 500 MG 24 hr tablet Take 2 tabs daily 60 tablet 11  . LamoTRIgine (LAMICTAL XR) 100 MG TB24 Take 1 tablet (100 mg total) by mouth daily. 30 tablet 11  . LamoTRIgine (LAMICTAL XR) 50 MG TB24 Take 1 tablet (50 mg total) by mouth daily. 30 tablet 11  .  olmesartan-hydrochlorothiazide (BENICAR HCT) 20-12.5 MG per tablet Take 1 tablet by mouth daily. 90 tablet 4  . QUEtiapine (SEROQUEL) 400 MG tablet Take 400 mg by mouth at bedtime.     No current facility-administered medications on file prior to visit.    ALLERGIES: Allergies  Allergen Reactions  . Naproxen Nausea Only    FAMILY HISTORY: Family History  Problem Relation Age of Onset  . Heart disease Mother     SOCIAL HISTORY: Social History   Social History  . Marital Status: Divorced    Spouse Name: N/A  . Number of Children: N/A  . Years of Education: N/A   Occupational History  . Not on file.   Social History Main Topics  . Smoking status: Never Smoker   . Smokeless tobacco: Not on file  . Alcohol Use: No  . Drug Use: No  . Sexual Activity: Not on file   Other Topics Concern  . Not on file   Social History Narrative    REVIEW OF SYSTEMS: Constitutional: No fevers, chills, or sweats, no generalized fatigue, change in appetite Eyes: No visual changes, double vision, eye  pain Ear, nose and throat: No hearing loss, ear pain, nasal congestion, sore throat Cardiovascular: No chest pain, palpitations Respiratory:  No shortness of breath at rest or with exertion, wheezes GastrointestinaI: No nausea, vomiting, diarrhea, abdominal pain, fecal incontinence Genitourinary:  No dysuria, urinary retention or frequency Musculoskeletal:  No neck pain, back pain Integumentary: No rash, pruritus, skin lesions Neurological: as above Psychiatric: + depression, insomnia, anxiety Endocrine: No palpitations, fatigue, diaphoresis, mood swings, change in appetite, change in weight, increased thirst Hematologic/Lymphatic:  No anemia, purpura, petechiae. Allergic/Immunologic: no itchy/runny eyes, nasal congestion, recent allergic reactions, rashes  PHYSICAL EXAM: Filed Vitals:   09/08/15 0954  BP: 110/90  Pulse: 79   General: No acute distress Head:   Normocephalic/atraumatic Neck: supple, no paraspinal tenderness, full range of motion Heart:  Regular rate and rhythm Lungs:  Clear to auscultation bilaterally Back: No paraspinal tenderness Skin/Extremities: No rash, no edema Neurological Exam: alert and oriented to person, place, and time. No aphasia or dysarthria. Fund of knowledge is appropriate.  Recent and remote memory are intact. 3/3 delayed recall.  Attention and concentration are normal.    Able to name objects and repeat phrases. Cranial nerves: Pupils equal, round, reactive to light.  Fundoscopic exam unremarkable, no papilledema. Extraocular movements intact with no nystagmus. Visual fields full. Facial sensation intact. No facial asymmetry. Tongue, uvula, palate midline.  Motor: Bulk and tone normal, muscle strength 5/5 throughout with no pronator drift.  Sensation to light touch intact.  No extinction to double simultaneous stimulation.  Deep tendon reflexes 2+ throughout, toes downgoing.  Finger to nose testing intact.  Gait narrow-based and steady, able to tandem walk adequately.  Romberg negative.  IMPRESSION: This is a 59 yo RH woman with a history of seizures since childhood with episodes of staring and unresponsiveness with head twitch to the right, and infrequent generalized convulsions, no convulsions in at least 20 years. She had presented with worsening memory and multiple daily seizures. 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 MRI brain unremarkable. She had reported a significant improvement in seizure frequency with only with Lamictal 150mg /day, however has been unable to obtain her 100mg  tablet prescription for the past 3 weeks and reports an increase in seizures and stress/mood changes. She still has the  50mg  tablet prescription and was instructed to take 3 tablets daily of this until she gets her prescription. She was instructed to contact the [facility] to stress the importance of needing seizure medications, and if more easily available for them, we can send a new prescription for 50mg  3 tabs daily instead of 2 different prescriptions. She will let us know once she hears from them. Continue Depakote ER 1000mg /day and Tegretol XR 800mg /day.Continue follow-up with psychiatry and therapy, she denies any suicidal ideation and knows to go to the ER if symptoms worsen. She does not drive and understands [place] driving laws. She will follow-up in 4 months and knows to call our office for any changes.  Thank you for allowing me to participate in her care.  Please do not hesitate to call for any questions or concerns.  The duration of this appointment visit was 15 minutes of face-to-face time with the patient.  Greater than 50% of this time was spent in counseling, explanation of diagnosis, planning of further management,  and coordination of care.   Ellouise Newer, M.D.   CC: Dr. Doreene Burke

## 2015-09-08 NOTE — Patient Instructions (Addendum)
1. With your current prescription, increase the Lamictal XR 50mg  to 3 tablets daily, then call [facility] to ask about which is best for them so you can get medication ASAP (ask if new prescription for Lamictal XR 50mg  3 tablets daily is better to get medication sooner, or how else to obtain medication asap) 2. Continue visits with therapist and psychiatrist 3. Continue Depakote and Tegretol 4. Follow-up in 4 months, call for any changes  Seizure Precautions; 1. If medication has been prescribed for you to prevent seizures, take it exactly as directed.  Do not stop taking the medicine without talking to your doctor first, even if you have not had a seizure in a long time.   2. Avoid activities in which a seizure would cause danger to yourself or to others.  Don't operate dangerous machinery, swim alone, or climb in high or dangerous places, such as on ladders, roofs, or girders.  Do not drive unless your doctor says you may.  3. If you have any warning that you may have a seizure, lay down in a safe place where you can't hurt yourself.    4.  No driving for 6 months from last seizure, as per [facility].   Please refer to the following link on the [place] website for more information: http://[email or web address].org/answerplace/Social/driving/drivingu.cfm   5.  Maintain good sleep hygiene.  6.  Contact your doctor if you have any problems that may be related to the medicine you are taking.  7.  Call 911 and bring the patient back to the ED if:        A.  The seizure lasts longer than 5 minutes.       B.  The patient doesn't awaken shortly after the seizure  C.  The patient has new problems such as difficulty seeing, speaking or moving  D.  The patient was injured during the seizure  E.  The patient has a temperature over 102 F (39C)  F.  The patient vomited and now is having trouble breathing

## 2015-10-08 ENCOUNTER — Telehealth: Payer: Self-pay | Admitting: Family Medicine

## 2015-10-08 NOTE — Telephone Encounter (Signed)
Left msg for patient to rtn my call.

## 2015-10-09 ENCOUNTER — Telehealth: Payer: Self-pay | Admitting: Family Medicine

## 2015-10-09 NOTE — Telephone Encounter (Signed)
Lmovm to rtn my call. Need to speak with patient about seizure medication.

## 2015-10-13 NOTE — Telephone Encounter (Signed)
Called patient again and was able to speak with her. We received a fax from her pharmacy stating that she has less tablets of her Lamictal than she should have for a 90 day supply. They wanted clarification on what patient is supposed to be taking. She is supposes to be taking (1) Lamictal XR 100 mg & (1) Lamictal XR 50 mg together daily.   When I asked patient about the quantity discrepancy she states that the amount of pills that were in her bottle when she got her new Rx refills the quantity was less than the 90 pills that were supposed to be in the bottle. She does get this medication from the pharmacy on a specialty program through [facility] which they get the medication for patient directly from the manufacturer. To cut down the confusion Dr. Delice Lesch just wants her to go to Lamictal XR 50 mg 3 tablets daily. I did relay this to patient she is to finish up the pills she has and she will then start on 50 mg 3 daily. I will call her pharmacy and let them know this as well. I did speak with Con Memos & Dawn at the pharmacy they are going to work on getting patient the XR 50 mg Rx they did tell me it could take up to 2 weeks.   Medication Assistance Program  Ph: [phone]

## 2015-10-24 ENCOUNTER — Telehealth: Payer: Self-pay | Admitting: Neurology

## 2015-10-24 NOTE — Telephone Encounter (Signed)
Pls have her see me back for an appointment to discuss this, ok to put in work-in slot. Thanks

## 2015-10-24 NOTE — Telephone Encounter (Signed)
Called patient back, I have scheduled her to come in on Monday @ 8:30. I did call [facility] and spoke with Kristeen Miss and made her aware of patients appt for Monday.

## 2015-10-24 NOTE — Telephone Encounter (Signed)
Leslie/from [facility]/ [phone] called to inform that pt is having more frequent Seizures

## 2015-10-24 NOTE — Telephone Encounter (Signed)
I returned call to [facility], she saw patient for a med management appt today. She states she is concerned because patient reported to her that she has been having seizures everyday. She states patient told her that she has been taking her medication as prescribed. I told her I would call the patient to find out what her sxs are. She requested that I call her assistant/Latisha back @ [phone] ext. [phone] to let her know what the recommendations are.   I did call patient and she told me that she is having seizures everyday, and that she has been having them daily for a while. I asked about any missed medications she did tell me she hasn't missed any meds, but she states that she might have missed a dose when she got confused about medication when they were trying to get her medication straightened out through her pharmacy assistance program which she doesn't think it was enough to cause her to have a seizure . She also states that she is having a lot of trouble with her memory. She states she did talk with [facility] about the memory and they told her they thought the memory issues were coming from the seizures. Please advise.

## 2015-10-27 ENCOUNTER — Encounter: Payer: Self-pay | Admitting: Neurology

## 2015-10-27 ENCOUNTER — Ambulatory Visit (INDEPENDENT_AMBULATORY_CARE_PROVIDER_SITE_OTHER): Payer: Self-pay | Admitting: Neurology

## 2015-10-27 VITALS — BP 112/84 | HR 84 | Ht 62.0 in | Wt 204.0 lb

## 2015-10-27 DIAGNOSIS — F329 Major depressive disorder, single episode, unspecified: Secondary | ICD-10-CM

## 2015-10-27 DIAGNOSIS — F411 Generalized anxiety disorder: Secondary | ICD-10-CM

## 2015-10-27 DIAGNOSIS — F32A Depression, unspecified: Secondary | ICD-10-CM

## 2015-10-27 DIAGNOSIS — G40319 Generalized idiopathic epilepsy and epileptic syndromes, intractable, without status epilepticus: Secondary | ICD-10-CM

## 2015-10-27 NOTE — Patient Instructions (Signed)
1. Once your new prescription comes, take Lamictal XR 50mg  3 tablets daily. Continue Depakote and Tegretol. 2. Restart your notebook of writing down everything, including when you take your medication and when you run out. 3. Continue working with your counselor and psychiatrist for the stress and depression 4. Follow-up in 2 months  Seizure Precautions: 1. If medication has been prescribed for you to prevent seizures, take it exactly as directed.  Do not stop taking the medicine without talking to your doctor first, even if you have not had a seizure in a long time.   2. Avoid activities in which a seizure would cause danger to yourself or to others.  Don't operate dangerous machinery, swim alone, or climb in high or dangerous places, such as on ladders, roofs, or girders.  Do not drive unless your doctor says you may.  3. If you have any warning that you may have a seizure, lay down in a safe place where you can't hurt yourself.    4.  No driving for 6 months from last seizure, as per [facility].   Please refer to the following link on the [place] website for more information: http://www.epilepsyfoundation.org/answerplace/Social/driving/drivingu.cfm   5.  Maintain good sleep hygiene.  6.  Contact your doctor if you have any problems that may be related to the medicine you are taking.  7.  Call 911 and bring the patient back to the ED if:        A.  The seizure lasts longer than 5 minutes.       B.  The patient doesn't awaken shortly after the seizure  C.  The patient has new problems such as difficulty seeing, speaking or moving  D.  The patient was injured during the seizure  E.  The patient has a temperature over 102 F (39C)  F.  The patient vomited and now is having trouble breathing

## 2015-10-27 NOTE — Progress Notes (Signed)
NEUROLOGY FOLLOW UP OFFICE NOTE  Amanda Roach [id number]  HISTORY OF PRESENT ILLNESS: I had the pleasure of seeing Amanda Roach in follow-up in the neurology clinic on 12/91/2016.  The patient was last seen 2 months ago for seizures and memory loss. EEG consistent with primary generalized epilepsy with frequent 1-5 second bursts of generalized discharges. On her last visit, she was more depressed and reported increased stress, as well as having difficulties obtaining her Lamictal, and thus reporting increased seizures. She reported her memory was worse as well. She had been taking Lamotrigine ER 100mg  and additional 50mg  for a total of 150mg  daily, and we tried to streamline this by instructing her to take 50mg  3 tablets daily instead. She has not been able to obtain the 50mg  tabs prescription, and reports she is still taking the 100+50mg . She continues to take Depakote ER 1000mg /day and Tegretol XR 800mg /day.   It appears that issues may stem from patient getting confused about her medications due to sometimes erratic delivery. She reports times when she is completely out of medication. She has not yet received the bottle for Lamotrigine ER 50mg  3 tabs daily, and continues to take 100+50mg , but continues to report daily seizures occurring back to back. She feels she bit her tongue last night. No convulsions. She does not drive. She reports that when she is out of her medications, she is "just out of it, I can't think straight, stutter, and become fidgety." She continues to report a lot of stress, it is stressful to answer the phone, she does not want to be bothered and does not want to bother other people. She stays at home a lot. She continues to see her counselor and therapist.   02/06/15: Lamictal level 3.9, Depakote level 50.1  HPI: This is a 59 yo RH Roach with a history of hypertension, depression, and seizures since age 49 or 67, presenting for worsening memory and continued seizures. She and  her sister report seizures started at age 36 or 22, she has "petit" ones and generalized convulsions. The "petit" seizures consist of staring and unresponsiveness with slight shaking of the head lasting 1 minute, sometimes occurring in clusters of 3 ro 4 within a 3 hour interval. They deny any lip smacking, dystonic posturing, or automatisms. She has had urinary incontinence and some tongue bite with these. She knows when she is having one, she feels her head turning to the right but cannot control it, loses focus, forgetting what she was doing, followed by generalized weakness. She lives alone and reports having the petit ones 2-3 times daily. She denies any generalized convulsions in at least 20 years. Her sister describes right head version with some of the convulsions. She denies any gustatory hallucinations, deja vu, rising epigastric sensation, focal numbness/tingling/weakness. She has had body jerks since childhood. She has told her sister she has smelled "poop" several times.   She and her sister report that she has always had memory problems as a child, but this has greatly increased in the past 20 years. She would say something that didn't happen, or forget things she was supposed to do. Her sister is concerned about how she had given her phone number to strangers and could not recall if she had given them the wrong number. She lives alone but family has stayed in contact with her daily. She would get paranoid if they do not call her. She has been working with her therapist on this, and has a schedule  book. She reports that her mood is really good some days, but other days "it's just no good," she sits and is "totally depressed." She can get very angry and violent at times. She denies any suicidal ideation. She takes Seroquel at bedtime.  Epilepsy Risk Factors: Her father and 2 sisters had seizures in childhood. Otherwise she had a normal birth and early development. There is no history of febrile  convulsions, CNS infections such as meningitis/encephalitis, significant traumatic brain injury, neurosurgical procedures  Prior AEDs: She recalls trying Dilantin and Phenobarbital when younger.   Diagnostic Data: Routine EEG was abnormal with occasional focal slowing over the left posterior temporal region, as well as frequent bursts of generalized high voltage irregular 4 Hz spike and wave discharges with frontal predominance lasting 1-4 seconds predominantly in drowsiness and sleep, consistent with a primary generalized epilepsy.  48-hour EEG showed similar occasional focal slowing over the left temporal region, and frequent generalized 4-5 Hz spike and polyspike and wave discharges lasting 1-5 seconds. There was shifting lead-in noted over the left and right frontal regions, raising the possibility of secondary bisynchrony. She did not write her seizures down in the diary, but reports she had a lot. It is still unclear if the epileptiform discharges lasting more than 3 seconds are symptomatic as she did not write down the times of her seizures. There were no prolonged discharges lasting more than 10 seconds seen.  MRI brain with and without contrast 09/2014 did not show any acute changes, hippocampi symmetric with no abnormal signal or enhancement. There was mild diffuse atrophy, mild chronic microvascular changes noted.   Last levels: 02/06/15 Lamictal 3.9 (on Lamictal 100mg /day), Depakote 50.1  PAST MEDICAL HISTORY: Past Medical History  Diagnosis Date  . Seizure ([place])   . Hypertension   . Knee pain   . Asthma   . Hyperlipidemia   . Seizures ([place])   . Depression   . Anxiety     MEDICATIONS: Current Outpatient Prescriptions on File Prior to Visit  Medication Sig Dispense Refill  . atorvastatin (LIPITOR) 10 MG tablet Take 1 tablet (10 mg total) by mouth daily. 90 tablet 3  . carbamazepine (TEGRETOL XR) 400 MG 12 hr tablet Take 2 tabs daily 60 tablet 11  . divalproex (DEPAKOTE ER)  500 MG 24 hr tablet Take 2 tabs daily 60 tablet 11  . LamoTRIgine (LAMICTAL XR) 100 MG TB24 Take 1 tablet (100 mg total) by mouth daily. 30 tablet 11  . LamoTRIgine (LAMICTAL XR) 50 MG TB24 Take 1 tablet (50 mg total) by mouth daily. 30 tablet 11  . olmesartan-hydrochlorothiazide (BENICAR HCT) 20-12.5 MG per tablet Take 1 tablet by mouth daily. 90 tablet 4  . QUEtiapine (SEROQUEL) 400 MG tablet Take 400 mg by mouth at bedtime.     No current facility-administered medications on file prior to visit.    ALLERGIES: Allergies  Allergen Reactions  . Naproxen Nausea Only    FAMILY HISTORY: Family History  Problem Relation Age of Onset  . Heart disease Mother     SOCIAL HISTORY: Social History   Social History  . Marital Status: Divorced    Spouse Name: N/A  . Number of Children: N/A  . Years of Education: N/A   Occupational History  . Not on file.   Social History Main Topics  . Smoking status: Never Smoker   . Smokeless tobacco: Not on file  . Alcohol Use: No  . Drug Use: No  . Sexual Activity: Not on  file   Other Topics Concern  . Not on file   Social History Narrative    REVIEW OF SYSTEMS: Constitutional: No fevers, chills, or sweats, no generalized fatigue, change in appetite Eyes: No visual changes, double vision, eye pain Ear, nose and throat: No hearing loss, ear pain, nasal congestion, sore throat Cardiovascular: No chest pain, palpitations Respiratory:  No shortness of breath at rest or with exertion, wheezes GastrointestinaI: No nausea, vomiting, diarrhea, abdominal pain, fecal incontinence Genitourinary:  No dysuria, urinary retention or frequency Musculoskeletal:  No neck pain, back pain Integumentary: No rash, pruritus, skin lesions Neurological: as above Psychiatric: + depression, insomnia, anxiety Endocrine: No palpitations, fatigue, diaphoresis, mood swings, change in appetite, change in weight, increased thirst Hematologic/Lymphatic:  No anemia,  purpura, petechiae. Allergic/Immunologic: no itchy/runny eyes, nasal congestion, recent allergic reactions, rashes  PHYSICAL EXAM: Filed Vitals:   10/27/15 0840  BP: 112/84  Pulse: 84   General: No acute distress Head:  Normocephalic/atraumatic Neck: supple, no paraspinal tenderness, full range of motion Heart:  Regular rate and rhythm Lungs:  Clear to auscultation bilaterally Back: No paraspinal tenderness Skin/Extremities: No rash, no edema Neurological Exam: alert and oriented to person, place, and time. No aphasia or dysarthria. Fund of knowledge is appropriate.  Recent and remote memory are intact. Attention and concentration are normal.    Able to name objects and repeat phrases. Cranial nerves: Pupils equal, round, reactive to light.  Fundoscopic exam unremarkable, no papilledema. Extraocular movements intact with no nystagmus. Visual fields full. Facial sensation intact. No facial asymmetry. Tongue, uvula, palate midline.  Motor: Bulk and tone normal, muscle strength 5/5 throughout with no pronator drift.  Sensation to light touch intact.  No extinction to double simultaneous stimulation.  Deep tendon reflexes 2+ throughout, toes downgoing.  Finger to nose testing intact.  Gait narrow-based and steady, able to tandem walk adequately.  Romberg negative.  IMPRESSION: This is a 59 yo RH Roach with a history of seizures since childhood with episodes of staring and unresponsiveness with head twitch to the right, and infrequent generalized convulsions, no convulsions in at least 20 years. She had presented with worsening memory and multiple daily seizures. 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 MRI brain unremarkable. She had reported a significant improvement in seizure  frequency with Lamictal 150mg /day, however recently has been having more difficulties and reporting being back to daily seizures, as well as worsening depression. The increased seizures may be due to worsening mood, as well as erratic availability of her medication. I discussed with her that once she gets the 50mg  tablet prescription of Lamotrigine, she is to take 3 tablets daily, in addition to Depakote ER 1000mg /day and Tegretol XR 800mg /day.She was instructed to resume writing everything down, including when she has her medications or runs out. Continue follow-up with psychiatry and therapy, she denies any suicidal ideation and knows to go to the ER if symptoms worsen. She does not drive and understands [place] driving laws. She will follow-up in 2 months and knows to call our office for any changes.  Thank you for allowing me to participate in her care.  Please do not hesitate to call for any questions or concerns.  The duration of this appointment visit was 25 minutes of face-to-face time with the patient.  Greater than 50% of  this time was spent in counseling, explanation of diagnosis, planning of further management, and coordination of care.   Ellouise Newer, M.D.   CC: Dr. Doreene Burke

## 2015-10-30 NOTE — Telephone Encounter (Signed)
error 

## 2015-11-06 ENCOUNTER — Other Ambulatory Visit: Payer: Self-pay | Admitting: Family Medicine

## 2015-11-06 ENCOUNTER — Telehealth: Payer: Self-pay | Admitting: Neurology

## 2015-11-06 DIAGNOSIS — R569 Unspecified convulsions: Secondary | ICD-10-CM

## 2015-11-06 MED ORDER — DIVALPROEX SODIUM ER 500 MG PO TB24: ORAL_TABLET | ORAL | Status: DC

## 2015-11-06 MED ORDER — CARBAMAZEPINE ER 400 MG PO TB12
ORAL_TABLET | ORAL | Status: DC
Start: 2015-11-06 — End: 2016-01-09

## 2015-11-06 NOTE — Telephone Encounter (Signed)
Pt came in to the office she stating that she needs a refill on tegretol xr and depakote called into the [facility] pt phone number is [phone]

## 2015-11-06 NOTE — Telephone Encounter (Signed)
Returned call to patient. Notified her that Rx's were faxed to her pharmacy this afternoon.

## 2016-01-07 ENCOUNTER — Ambulatory Visit: Payer: Self-pay | Admitting: Neurology

## 2016-01-07 DIAGNOSIS — Z029 Encounter for administrative examinations, unspecified: Secondary | ICD-10-CM

## 2016-01-09 ENCOUNTER — Emergency Department (HOSPITAL_COMMUNITY): Payer: Medicare Other

## 2016-01-09 ENCOUNTER — Encounter (HOSPITAL_COMMUNITY): Payer: Self-pay | Admitting: Emergency Medicine

## 2016-01-09 ENCOUNTER — Emergency Department (HOSPITAL_COMMUNITY)
Admission: EM | Admit: 2016-01-09 | Discharge: 2016-01-09 | Disposition: A | Discharge status: Home / Self Care | Payer: Medicare Other | Attending: Emergency Medicine | Admitting: Emergency Medicine

## 2016-01-09 ENCOUNTER — Ambulatory Visit (INDEPENDENT_AMBULATORY_CARE_PROVIDER_SITE_OTHER): Payer: Medicare Other | Admitting: Neurology

## 2016-01-09 ENCOUNTER — Encounter: Payer: Self-pay | Admitting: Neurology

## 2016-01-09 VITALS — BP 132/82 | HR 87 | Ht 62.0 in | Wt 209.0 lb

## 2016-01-09 DIAGNOSIS — M25572 Pain in left ankle and joints of left foot: Secondary | ICD-10-CM | POA: Diagnosis not present

## 2016-01-09 DIAGNOSIS — Y998 Other external cause status: Secondary | ICD-10-CM | POA: Insufficient documentation

## 2016-01-09 DIAGNOSIS — I10 Essential (primary) hypertension: Secondary | ICD-10-CM | POA: Diagnosis not present

## 2016-01-09 DIAGNOSIS — G40319 Generalized idiopathic epilepsy and epileptic syndromes, intractable, without status epilepticus: Secondary | ICD-10-CM

## 2016-01-09 DIAGNOSIS — F329 Major depressive disorder, single episode, unspecified: Secondary | ICD-10-CM | POA: Insufficient documentation

## 2016-01-09 DIAGNOSIS — Y9389 Activity, other specified: Secondary | ICD-10-CM | POA: Diagnosis not present

## 2016-01-09 DIAGNOSIS — M79672 Pain in left foot: Secondary | ICD-10-CM | POA: Diagnosis not present

## 2016-01-09 DIAGNOSIS — Z79899 Other long term (current) drug therapy: Secondary | ICD-10-CM | POA: Diagnosis not present

## 2016-01-09 DIAGNOSIS — R569 Unspecified convulsions: Secondary | ICD-10-CM | POA: Diagnosis not present

## 2016-01-09 DIAGNOSIS — S99912A Unspecified injury of left ankle, initial encounter: Secondary | ICD-10-CM | POA: Insufficient documentation

## 2016-01-09 DIAGNOSIS — J45909 Unspecified asthma, uncomplicated: Secondary | ICD-10-CM | POA: Insufficient documentation

## 2016-01-09 DIAGNOSIS — F419 Anxiety disorder, unspecified: Secondary | ICD-10-CM | POA: Diagnosis not present

## 2016-01-09 DIAGNOSIS — E785 Hyperlipidemia, unspecified: Secondary | ICD-10-CM | POA: Insufficient documentation

## 2016-01-09 DIAGNOSIS — Y9289 Other specified places as the place of occurrence of the external cause: Secondary | ICD-10-CM | POA: Diagnosis not present

## 2016-01-09 DIAGNOSIS — W010XXA Fall on same level from slipping, tripping and stumbling without subsequent striking against object, initial encounter: Secondary | ICD-10-CM | POA: Diagnosis not present

## 2016-01-09 DIAGNOSIS — M7989 Other specified soft tissue disorders: Secondary | ICD-10-CM | POA: Diagnosis not present

## 2016-01-09 MED ORDER — CARBAMAZEPINE ER 400 MG PO TB12: ORAL_TABLET | ORAL | Status: DC

## 2016-01-09 MED ORDER — IBUPROFEN 800 MG PO TABS
800.0000 mg | ORAL_TABLET | Freq: Once | ORAL | Status: AC
  Administered 2016-01-09: 800 mg via ORAL
  Filled 2016-01-09: qty 1

## 2016-01-09 MED ORDER — HYDROCODONE-ACETAMINOPHEN 5-325 MG PO TABS: 1.0000 | ORAL_TABLET | Freq: Four times a day (QID) | ORAL | Status: DC | PRN

## 2016-01-09 MED ORDER — DIVALPROEX SODIUM ER 500 MG PO TB24: ORAL_TABLET | ORAL | Status: DC

## 2016-01-09 MED ORDER — LAMOTRIGINE ER 50 MG PO TB24
ORAL_TABLET | ORAL | Status: DC
Start: 2016-01-09 — End: 2016-06-04

## 2016-01-09 MED ORDER — HYDROCODONE-ACETAMINOPHEN 5-325 MG PO TABS
2.0000 | ORAL_TABLET | Freq: Once | ORAL | Status: AC
  Administered 2016-01-09: 2 via ORAL
  Filled 2016-01-09: qty 2

## 2016-01-09 NOTE — Discharge Instructions (Signed)

## 2016-01-09 NOTE — ED Notes (Signed)
Brought in by EMS from home with c/o left leg pain after her fall tonight.  Pt reports that she tripped and fell on the hardwood floor.  Has had immediate pain to left lower leg down to foot after the fall.  Further reports that she was able to stand and bear weight to her left leg after the fall.  No s/s obvious deformity observed.

## 2016-01-09 NOTE — Patient Instructions (Signed)
1. Continue Depakote, Tegretol, and Lamictal as prescribed. Once you are down to 5 pills, call your pharmacy for refills. Call them today for emergency medicine until full prescription comes on Monday 2. Discuss sleep issues and depression with your psychiatrist and counselor 3. Follow-up in 6 months, call for any changes  Seizure Precautions: 1. If medication has been prescribed for you to prevent seizures, take it exactly as directed.  Do not stop taking the medicine without talking to your doctor first, even if you have not had a seizure in a long time.   2. Avoid activities in which a seizure would cause danger to yourself or to others.  Don't operate dangerous machinery, swim alone, or climb in high or dangerous places, such as on ladders, roofs, or girders.  Do not drive unless your doctor says you may.  3. If you have any warning that you may have a seizure, lay down in a safe place where you can't hurt yourself.    4.  No driving for 6 months from last seizure, as per [facility].   Please refer to the following link on the [place] website for more information: http://[email or web address]/[email or web address]/drivingu.cfm   5.  Maintain good sleep hygiene. Avoid alcohol.  6.  Contact your doctor if you have any problems that may be related to the medicine you are taking.  7.  Call 911 and bring the patient back to the ED if:        A.  The seizure lasts longer than 5 minutes.       B.  The patient doesn't awaken shortly after the seizure  C.  The patient has new problems such as difficulty seeing, speaking or moving  D.  The patient was injured during the seizure  E.  The patient has a temperature over 102 F (39C)  F.  The patient vomited and now is having trouble breathing

## 2016-01-09 NOTE — ED Provider Notes (Signed)
CSN: [id number]     Arrival date & time 01/09/16  2144 History   First MD Initiated Contact with Patient 01/09/16 2150     Chief Complaint  Patient presents with  . Fall  . Leg Pain     (Consider location/radiation/quality/duration/timing/severity/associated sxs/prior Treatment) HPI Comments: Patient presents to the emergency department with chief complaint of left leg pain. She is specifically in pain in her left ankle. The pain is worsened with palpation and movement. She states that she tripped tonight and fell, twisting her leg and ankle. She states that she was able to bear weight immediately after the fall, but since then the pain prevents her from ambulating. She has not taken anything for the pain. The pain radiates from her ankle to her knee. She denies any other symptoms.  The history is provided by the patient. No language interpreter was used.    Past Medical History  Diagnosis Date  . Seizure ([place])   . Hypertension   . Knee pain   . Asthma   . Hyperlipidemia   . Seizures ([place])   . Depression   . Anxiety    Past Surgical History  Procedure Laterality Date  . Breast surgery      breast reduction  . Esophagogastroduodenoscopy N/A 04/30/2013    Procedure: ESOPHAGOGASTRODUODENOSCOPY (EGD);  Surgeon: Jeryl Columbia, MD;  Location: [facility] ENDOSCOPY;  Service: Endoscopy;  Laterality: N/A;  barb Greggory Brandy   Family History  Problem Relation Age of Onset  . Heart disease Mother    Social History  Substance Use Topics  . Smoking status: Never Smoker   . Smokeless tobacco: None  . Alcohol Use: No   OB History    No data available     Review of Systems  All other systems reviewed and are negative.     Allergies  Naproxen  Home Medications   Prior to Admission medications   Medication Sig Start Date End Date Taking? Authorizing Provider  atorvastatin (LIPITOR) 10 MG tablet Take 1 tablet (10 mg total) by mouth daily. 09/08/15   Lance Bosch, NP  carbamazepine (TEGRETOL  XR) 400 MG 12 hr tablet Take 2 tablets every morning 01/09/16   Cameron Sprang, MD  divalproex (DEPAKOTE ER) 500 MG 24 hr tablet Take 2 tablets at bedtime 01/09/16   Cameron Sprang, MD  LamoTRIgine (LAMICTAL XR) 50 MG TB24 Take 3 tablets daily 01/09/16   Cameron Sprang, MD  olmesartan-hydrochlorothiazide (BENICAR HCT) 20-12.5 MG per tablet Take 1 tablet by mouth daily. 07/07/15   Lance Bosch, NP  QUEtiapine (SEROQUEL) 400 MG tablet Take 400 mg by mouth at bedtime.    Historical Provider, MD   BP 104/69 mmHg  Pulse 92  Temp(Src) 98.4 F (36.9 C)  Resp 18  SpO2 94% Physical Exam  Constitutional: She is oriented to person, place, and time. She appears well-developed and well-nourished.  HENT:  Head: Normocephalic and atraumatic.  Eyes: Conjunctivae and EOM are normal.  Neck: Normal range of motion.  Cardiovascular: Normal rate and intact distal pulses.   Intact distal pulses with brisk capillary refill  Pulmonary/Chest: Effort normal.  Abdominal: She exhibits no distension.  Musculoskeletal: Normal range of motion.  Range of motion and strength of left ankle is limited secondary to pain, no bony abnormality or deformity, no contusions or ecchymosis  Neurological: She is alert and oriented to person, place, and time.  Sensation objectively intact  Skin: Skin is dry. No rash noted. No erythema.  Psychiatric: She has a normal mood and affect. Her behavior is normal. Judgment and thought content normal.  Nursing note and vitals reviewed.   ED Course  Procedures (including critical care time)  Imaging Review Dg Ankle Complete Left  01/09/2016  CLINICAL DATA:  Initial encounter for Pt fell over a chair tonight and injured left foot and ankle. Pain and swelling more to lateral aspect of foot/ankle. EXAM: LEFT ANKLE COMPLETE - 3+ VIEW COMPARISON:  None. FINDINGS: Mild bimalleolar soft tissue swelling. Base of fifth metatarsal and talar dome intact. On the oblique/mortise view, subtle lucency  about the distal fibula is favored to be due to overlap of the adjacent calcaneus. IMPRESSION: Bimalleolar soft tissue swelling, without definite fracture. Favor artifactual lucency about the distal fibula on the mortise view. If the patient has point tenderness in this area, repeat radiographs in 5-7 days should be considered. Electronically Signed   By: Abigail Miyamoto M.D.   On: 01/09/2016 22:32   Dg Foot Complete Left  01/09/2016  CLINICAL DATA:  Status post fall over chair, with injury to left foot and ankle. Pain and swelling about the lateral aspect of the foot and ankle. Initial encounter. EXAM: LEFT FOOT - COMPLETE 3+ VIEW COMPARISON:  None. FINDINGS: There is no evidence of fracture or dislocation. The joint spaces are preserved. There is no evidence of talar subluxation; the subtalar joint is unremarkable in appearance. An os peroneum is noted. No significant soft tissue abnormalities are seen. IMPRESSION: 1. No evidence of fracture or dislocation. 2. Os peroneum noted. Electronically Signed   By: Garald Balding M.D.   On: 01/09/2016 22:35   I have personally reviewed and evaluated these images and lab results as part of my medical decision-making.    MDM   Final diagnoses:  Ankle injury, left, initial encounter    Patient with mechanical fall after tripping over a stool. Twisted left ankle. Will check plain films, treat pain, and will reassess. No bony abnormality or deformity on exam.  Plain films as above.  Will give cam walker, crutches, and recommend ortho follow-up in 5-7 days for repeat x-ray and follow-up.    Montine Circle, PA-C 01/09/16 Kellyton Yao, MD 01/09/16 [phone]

## 2016-01-09 NOTE — Progress Notes (Signed)
NEUROLOGY FOLLOW UP OFFICE NOTE  Amanda Roach IH:[id number]  HISTORY OF PRESENT ILLNESS: I had the pleasure of seeing Amanda Roach in follow-up in the neurology clinic on 01/09/2016.  The patient was last seen 3 months ago for seizures and memory loss. EEG consistent with primary generalized epilepsy with frequent 1-5 second bursts of generalized discharges. On her last visit, she was more depressed and reported increased stress, as well as having difficulties obtaining her Lamictal, and thus reporting increased seizures. We streamlined her medications, she is now taking Lamictal XR 50mg  3 tablets daily, in addition to Depakote ER 1000mg /day and Tegretol XR 800mg /day. She reports that the daytime seizures have stopped, she has not had any "petit" seizures since her last visit. She is concerned she may have had 2 seizures in her sleep, waking up with a tongue bite, most recently last night. She continues to be depressed, adn states she gets depressed a lot of times and feels she is not doing well with it. She continues to follow regularly with Behavioral Health. She has sleep difficulties, either sleeping at 2am or waking up early morning and thinking of different things. She denies any dizziness, diplopia, dysarthria, focal numbness/tingling/weakness, no falls.   02/06/15: Lamictal level 3.9, Depakote level 50.1  HPI: This is a 60 yo RH woman with a history of hypertension, depression, and seizures since age 60 or 73, presenting for worsening memory and continued seizures. She and her sister report seizures started at age 60 or 11, she has "petit" ones and generalized convulsions. The "petit" seizures consist of staring and unresponsiveness with slight shaking of the head lasting 1 minute, sometimes occurring in clusters of 3 ro 4 within a 3 hour interval. They deny any lip smacking, dystonic posturing, or automatisms. She has had urinary incontinence and some tongue bite with these. She knows when she is  having one, she feels her head turning to the right but cannot control it, loses focus, forgetting what she was doing, followed by generalized weakness. She lives alone and reports having the petit ones 2-3 times daily. She denies any generalized convulsions in at least 20 years. Her sister describes right head version with some of the convulsions. She denies any gustatory hallucinations, deja vu, rising epigastric sensation, focal numbness/tingling/weakness. She has had body jerks since childhood. She has told her sister she has smelled "poop" several times.   She and her sister report that she has always had memory problems as a child, but this has greatly increased in the past 20 years. She would say something that didn't happen, or forget things she was supposed to do. Her sister is concerned about how she had given her phone number to strangers and could not recall if she had given them the wrong number. She lives alone but family has stayed in contact with her daily. She would get paranoid if they do not call her. She has been working with her therapist on this, and has a schedule book. She reports that her mood is really good some days, but other days "it's just no good," she sits and is "totally depressed." She can get very angry and violent at times. She denies any suicidal ideation. She takes Seroquel at bedtime.  Epilepsy Risk Factors: Her father and 2 sisters had seizures in childhood. Otherwise she had a normal birth and early development. There is no history of febrile convulsions, CNS infections such as meningitis/encephalitis, significant traumatic brain injury, neurosurgical procedures  Prior AEDs: She  recalls trying Dilantin and Phenobarbital when younger.   Diagnostic Data: Routine EEG was abnormal with occasional focal slowing over the left posterior temporal region, as well as frequent bursts of generalized high voltage irregular 4 Hz spike and wave discharges with frontal predominance  lasting 1-4 seconds predominantly in drowsiness and sleep, consistent with a primary generalized epilepsy.  48-hour EEG showed similar occasional focal slowing over the left temporal region, and frequent generalized 4-5 Hz spike and polyspike and wave discharges lasting 1-5 seconds. There was shifting lead-in noted over the left and right frontal regions, raising the possibility of secondary bisynchrony. She did not write her seizures down in the diary, but reports she had a lot. It is still unclear if the epileptiform discharges lasting more than 3 seconds are symptomatic as she did not write down the times of her seizures. There were no prolonged discharges lasting more than 10 seconds seen.  MRI brain with and without contrast 09/2014 did not show any acute changes, hippocampi symmetric with no abnormal signal or enhancement. There was mild diffuse atrophy, mild chronic microvascular changes noted.   Last levels: 02/06/15 Lamictal 3.9 (on Lamictal 100mg /day), Depakote 50.1  PAST MEDICAL HISTORY: Past Medical History  Diagnosis Date  . Seizure ([place])   . Hypertension   . Knee pain   . Asthma   . Hyperlipidemia   . Seizures ([place])   . Depression   . Anxiety     MEDICATIONS: Current Outpatient Prescriptions on File Prior to Visit  Medication Sig Dispense Refill  . atorvastatin (LIPITOR) 10 MG tablet Take 1 tablet (10 mg total) by mouth daily. 90 tablet 3  . carbamazepine (TEGRETOL XR) 400 MG 12 hr tablet Take 2 tablets every morning 60 tablet 11  . divalproex (DEPAKOTE ER) 500 MG 24 hr tablet Take 2 tablets at bedtime 60 tablet 11  . LamoTRIgine (LAMICTAL XR) 50 MG TB24 Take 1 tablet (50 mg total) by mouth daily. (Patient taking differently: Take 3 tablets daily) 30 tablet 11  . olmesartan-hydrochlorothiazide (BENICAR HCT) 20-12.5 MG per tablet Take 1 tablet by mouth daily. 90 tablet 4  . QUEtiapine (SEROQUEL) 400 MG tablet Take 400 mg by mouth at bedtime.     No current  facility-administered medications on file prior to visit.    ALLERGIES: Allergies  Allergen Reactions  . Naproxen Nausea Only    FAMILY HISTORY: Family History  Problem Relation Age of Onset  . Heart disease Mother     SOCIAL HISTORY: Social History   Social History  . Marital Status: Divorced    Spouse Name: N/A  . Number of Children: N/A  . Years of Education: N/A   Occupational History  . Not on file.   Social History Main Topics  . Smoking status: Never Smoker   . Smokeless tobacco: Not on file  . Alcohol Use: No  . Drug Use: No  . Sexual Activity: Not on file   Other Topics Concern  . Not on file   Social History Narrative    REVIEW OF SYSTEMS: Constitutional: No fevers, chills, or sweats, no generalized fatigue, change in appetite Eyes: No visual changes, double vision, eye pain Ear, nose and throat: No hearing loss, ear pain, nasal congestion, sore throat Cardiovascular: No chest pain, palpitations Respiratory:  No shortness of breath at rest or with exertion, wheezes GastrointestinaI: No nausea, vomiting, diarrhea, abdominal pain, fecal incontinence Genitourinary:  No dysuria, urinary retention or frequency Musculoskeletal:  No neck pain, back pain Integumentary: No  rash, pruritus, skin lesions Neurological: as above Psychiatric: + depression, insomnia, anxiety Endocrine: No palpitations, fatigue, diaphoresis, mood swings, change in appetite, change in weight, increased thirst Hematologic/Lymphatic:  No anemia, purpura, petechiae. Allergic/Immunologic: no itchy/runny eyes, nasal congestion, recent allergic reactions, rashes  PHYSICAL EXAM: Filed Vitals:   01/09/16 1454  BP: 132/82  Pulse: 87   General: No acute distress Head:  Normocephalic/atraumatic Neck: supple, no paraspinal tenderness, full range of motion Heart:  Regular rate and rhythm Lungs:  Clear to auscultation bilaterally Back: No paraspinal tenderness Skin/Extremities: No rash,  no edema Neurological Exam: alert and oriented to person, place, and time. No aphasia or dysarthria. Fund of knowledge is appropriate.  Recent and remote memory are intact. 2/3 delayed recall. Attention and concentration are normal.    Able to name objects and repeat phrases. Cranial nerves: Pupils equal, round, reactive to light.  Extraocular movements intact with no nystagmus. Visual fields full. Facial sensation intact. No facial asymmetry. Tongue, uvula, palate midline.  Motor: Bulk and tone normal, muscle strength 5/5 throughout with no pronator drift.  Sensation to light touch intact.  No extinction to double simultaneous stimulation.  Deep tendon reflexes 2+ throughout, toes downgoing.  Finger to nose testing intact.  Gait narrow-based and steady, able to tandem walk adequately.  Romberg negative.  IMPRESSION: This is a 60 yo RH woman with a history of seizures since childhood with episodes of staring and unresponsiveness with head twitch to the right, and infrequent generalized convulsions, no convulsions in at least 20 years. She had presented with worsening memory and multiple daily seizures. 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 MRI brain unremarkable. She had reported a significant improvement in seizure frequency with Lamictal 150mg /day, however on last visit reported daily seizures, mostly related to erratic availability of her medication. Medication has been streamlined, she is now on Lamictal XR 50mg  3 tablets daily, in addition to Depakote ER 1000mg /day and Tegretol XR 800mg /day, with cessation of daily "petit" seizures. She has woken up twice with her tongue bitten. Continue current doses of medications. Continue follow-up with psychiatry and therapy, she denies any  suicidal ideation and knows to go to the ER if symptoms worsen. She does not drive and understands [place] driving laws. She will follow-up in 6 months and knows to call our office for any changes.  Thank you for allowing me to participate in her care.  Please do not hesitate to call for any questions or concerns.  The duration of this appointment visit was 15 minutes of face-to-face time with the patient.  Greater than 50% of this time was spent in counseling, explanation of diagnosis, planning of further management, and coordination of care.   Amanda Roach, M.D.   CC: Dr. Doreene Burke

## 2016-01-23 DIAGNOSIS — S93432A Sprain of tibiofibular ligament of left ankle, initial encounter: Secondary | ICD-10-CM | POA: Diagnosis not present

## 2016-01-27 ENCOUNTER — Ambulatory Visit: Payer: Self-pay | Admitting: Neurology

## 2016-02-10 DIAGNOSIS — S93432A Sprain of tibiofibular ligament of left ankle, initial encounter: Secondary | ICD-10-CM | POA: Diagnosis not present

## 2016-02-28 DIAGNOSIS — S93432D Sprain of tibiofibular ligament of left ankle, subsequent encounter: Secondary | ICD-10-CM | POA: Diagnosis not present

## 2016-04-07 ENCOUNTER — Telehealth: Payer: Self-pay | Admitting: Internal Medicine

## 2016-04-07 NOTE — Telephone Encounter (Signed)
Patient dropped off paperwork to be filled by the doctor. Please follow up.

## 2016-04-14 NOTE — Telephone Encounter (Signed)
Patient verified DOB Patient is aware of page 3 being completed of her packet and being placed at the front desk for pick up. No further questions at this time.

## 2016-06-04 ENCOUNTER — Other Ambulatory Visit: Payer: Self-pay | Admitting: Neurology

## 2016-06-04 DIAGNOSIS — R569 Unspecified convulsions: Secondary | ICD-10-CM

## 2016-06-04 DIAGNOSIS — G40319 Generalized idiopathic epilepsy and epileptic syndromes, intractable, without status epilepticus: Secondary | ICD-10-CM

## 2016-06-04 MED ORDER — LAMOTRIGINE ER 50 MG PO TB24: ORAL_TABLET | ORAL | 1 refills | Status: DC

## 2016-06-04 NOTE — Telephone Encounter (Signed)
Lamictal refill requested. Per last office note- patient to remain on medication. Refill approved and sent to patient's pharmacy.

## 2016-08-17 ENCOUNTER — Encounter: Payer: Self-pay | Admitting: Family Medicine

## 2016-08-17 ENCOUNTER — Ambulatory Visit: Payer: Medicare Other | Attending: Family Medicine | Admitting: Family Medicine

## 2016-08-17 VITALS — BP 145/83 | HR 73 | Temp 98.2°F | Ht 61.0 in | Wt 221.6 lb

## 2016-08-17 DIAGNOSIS — M545 Low back pain: Secondary | ICD-10-CM | POA: Diagnosis not present

## 2016-08-17 DIAGNOSIS — R200 Anesthesia of skin: Secondary | ICD-10-CM | POA: Insufficient documentation

## 2016-08-17 DIAGNOSIS — Z9889 Other specified postprocedural states: Secondary | ICD-10-CM | POA: Insufficient documentation

## 2016-08-17 DIAGNOSIS — E7800 Pure hypercholesterolemia, unspecified: Secondary | ICD-10-CM | POA: Diagnosis not present

## 2016-08-17 DIAGNOSIS — M62830 Muscle spasm of back: Secondary | ICD-10-CM | POA: Diagnosis not present

## 2016-08-17 DIAGNOSIS — R569 Unspecified convulsions: Secondary | ICD-10-CM | POA: Insufficient documentation

## 2016-08-17 DIAGNOSIS — F329 Major depressive disorder, single episode, unspecified: Secondary | ICD-10-CM | POA: Diagnosis not present

## 2016-08-17 DIAGNOSIS — Z79899 Other long term (current) drug therapy: Secondary | ICD-10-CM | POA: Insufficient documentation

## 2016-08-17 DIAGNOSIS — J45909 Unspecified asthma, uncomplicated: Secondary | ICD-10-CM | POA: Diagnosis not present

## 2016-08-17 DIAGNOSIS — F419 Anxiety disorder, unspecified: Secondary | ICD-10-CM | POA: Diagnosis not present

## 2016-08-17 DIAGNOSIS — Z888 Allergy status to other drugs, medicaments and biological substances status: Secondary | ICD-10-CM | POA: Diagnosis not present

## 2016-08-17 DIAGNOSIS — E785 Hyperlipidemia, unspecified: Secondary | ICD-10-CM | POA: Insufficient documentation

## 2016-08-17 DIAGNOSIS — I10 Essential (primary) hypertension: Secondary | ICD-10-CM

## 2016-08-17 DIAGNOSIS — M1711 Unilateral primary osteoarthritis, right knee: Secondary | ICD-10-CM

## 2016-08-17 MED ORDER — ATORVASTATIN CALCIUM 10 MG PO TABS: 10.0000 mg | ORAL_TABLET | Freq: Every day | ORAL | 1 refills | Status: DC

## 2016-08-17 MED ORDER — GABAPENTIN 300 MG PO CAPS: 300.0000 mg | ORAL_CAPSULE | Freq: Two times a day (BID) | ORAL | 2 refills | Status: DC

## 2016-08-17 MED ORDER — OLMESARTAN MEDOXOMIL-HCTZ 20-12.5 MG PO TABS: 1.0000 | ORAL_TABLET | Freq: Every day | ORAL | 1 refills | Status: DC

## 2016-08-17 MED ORDER — METHOCARBAMOL 500 MG PO TABS: 500.0000 mg | ORAL_TABLET | Freq: Two times a day (BID) | ORAL | 1 refills | Status: AC | PRN

## 2016-08-17 NOTE — Patient Instructions (Signed)

## 2016-08-18 NOTE — Progress Notes (Signed)
Subjective:  Patient ID: Amanda Roach, female    DOB: 1956-02-08  Age: 60 y.o. MRN: [id number]  CC: Hypertension and Back Pain (lower)   HPI Amanda Roach is a  60 year old female with history of hypertension, seizures, hyperlipidemia who comes into the clinic for follow-up visit.  Seizures is managed by [facility] neurology with her last seizure 3 weeks ago however she stated seizures have been infrequent and she has been permitted to drive short distances by her neurologist.  Compliant with her antihypertensive but does not take medications for hypercholesterolemia. Complains of back pain which is intermittent and spreads across her back feeling like a muscle spasm; denies radiation to lower extremity and pain is a 4/10. Also complains of numbness in the lateral aspect of right knee which is intermittent but she denies any pain. Has a remote history of trauma to the right knee.  Past Medical History:  Diagnosis Date  . Anxiety   . Asthma   . Depression   . Hyperlipidemia   . Hypertension   . Knee pain   . Seizure ([place])   . Seizures ([place])     Past Surgical History:  Procedure Laterality Date  . BREAST SURGERY     breast reduction  . ESOPHAGOGASTRODUODENOSCOPY N/A 04/30/2013   Procedure: ESOPHAGOGASTRODUODENOSCOPY (EGD);  Surgeon: Jeryl Columbia, MD;  Location: [facility] ENDOSCOPY;  Service: Endoscopy;  Laterality: N/A;  barb /ja    Allergies  Allergen Reactions  . Naproxen Nausea Only     Outpatient Medications Prior to Visit  Medication Sig Dispense Refill  . carbamazepine (TEGRETOL XR) 400 MG 12 hr tablet Take 2 tablets every morning 180 tablet 3  . divalproex (DEPAKOTE ER) 500 MG 24 hr tablet Take 2 tablets at bedtime 180 tablet 3  . LamoTRIgine (LAMICTAL XR) 50 MG TB24 Take 3 tablets daily 270 tablet 1  . QUEtiapine (SEROQUEL) 400 MG tablet Take 400 mg by mouth at bedtime.    Marland Kitchen atorvastatin (LIPITOR) 10 MG tablet Take 1 tablet (10 mg total) by mouth daily.  90 tablet 3  . olmesartan-hydrochlorothiazide (BENICAR HCT) 20-12.5 MG per tablet Take 1 tablet by mouth daily. 90 tablet 4  . HYDROcodone-acetaminophen (NORCO/VICODIN) 5-325 MG tablet Take 1-2 tablets by mouth every 6 (six) hours as needed. (Patient not taking: Reported on 08/17/2016) 15 tablet 0   No facility-administered medications prior to visit.     ROS Review of Systems  Constitutional: Negative for activity change, appetite change and fatigue.  HENT: Negative for congestion, sinus pressure and sore throat.   Eyes: Negative for visual disturbance.  Respiratory: Negative for cough, chest tightness, shortness of breath and wheezing.   Cardiovascular: Negative for chest pain and palpitations.  Gastrointestinal: Negative for abdominal distention, abdominal pain and constipation.  Endocrine: Negative for polydipsia.  Genitourinary: Negative for dysuria and frequency.  Musculoskeletal: Positive for back pain. Negative for arthralgias.  Skin: Negative for rash.  Neurological: Negative for tremors, light-headedness and numbness.  Hematological: Does not bruise/bleed easily.  Psychiatric/Behavioral: Negative for agitation and behavioral problems.    Objective:  BP (!) 145/83 (BP Location: Right Arm, Patient Position: Sitting, Cuff Size: Large)   Pulse 73   Temp 98.2 F (36.8 C) (Oral)   Ht 5\' 1"  (1.549 m)   Wt 221 lb 9.6 oz (100.5 kg)   SpO2 98%   BMI 41.87 kg/m   BP/Weight 08/17/2016 123XX123 [id number]  Systolic BP [id number] Q000111Q  Diastolic BP 83 69 82  Wt. (Lbs) 221.6 - 209  BMI 41.87 - 38.22      Physical Exam  Constitutional: She is oriented to person, place, and time. She appears well-developed and well-nourished.  Cardiovascular: Normal rate, normal heart sounds and intact distal pulses.   No murmur heard. Pulmonary/Chest: Effort normal and breath sounds normal. She has no wheezes. She has no rales. She exhibits no tenderness.  Abdominal: Soft. Bowel sounds are  normal. She exhibits no distension and no mass. There is no tenderness.  Musculoskeletal: Normal range of motion.  Neurological: She is alert and oriented to person, place, and time.    Lipid Panel     Component Value Date/Time   CHOL 244 (H) 07/01/2014 1304   TRIG 161 (H) 07/01/2014 1304   HDL 63 07/01/2014 1304   CHOLHDL 3.9 07/01/2014 1304   VLDL 32 07/01/2014 1304   LDLCALC 149 (H) 07/01/2014 1304    Assessment & Plan:   1. Essential hypertension Slightly elevated No change in regimen Low-sodium, DASH diet - olmesartan-hydrochlorothiazide (BENICAR HCT) 20-12.5 MG tablet; Take 1 tablet by mouth daily.  Dispense: 90 tablet; Refill: 1 - COMPLETE METABOLIC PANEL WITH GFR; Future - Lipid panel; Future  2. Pure hypercholesterolemia Uncontrolled Low cardiovascular risk Will check lipid panel again and reassess risk Low cholesterol diet, weight loss  3. Primary osteoarthritis of right knee She does not really have pain but describes this as a form of numbness Continue gabapentin  4. Muscle spasm of back Placed on the Biaxin  5. Seizures Last seizure was 3 weeks ago Managed by [facility] neurology  Meds ordered this encounter  Medications  . atorvastatin (LIPITOR) 10 MG tablet    Sig: Take 1 tablet (10 mg total) by mouth daily.    Dispense:  90 tablet    Refill:  1  . olmesartan-hydrochlorothiazide (BENICAR HCT) 20-12.5 MG tablet    Sig: Take 1 tablet by mouth daily.    Dispense:  90 tablet    Refill:  1  . gabapentin (NEURONTIN) 300 MG capsule    Sig: Take 1 capsule (300 mg total) by mouth 2 (two) times daily.    Dispense:  60 capsule    Refill:  2  . methocarbamol (ROBAXIN) 500 MG tablet    Sig: Take 1 tablet (500 mg total) by mouth 2 (two) times daily as needed for muscle spasms.    Dispense:  60 tablet    Refill:  1    Follow-up: Return in about 6 months (around 02/15/2017) for follow up on Hypertension.   This note has been created with Biomedical engineer. Any transcriptional errors are unintentional.    Arnoldo Morale MD

## 2016-08-24 ENCOUNTER — Other Ambulatory Visit: Payer: Self-pay

## 2016-08-30 ENCOUNTER — Ambulatory Visit: Payer: Medicare Other | Attending: Internal Medicine

## 2016-08-30 DIAGNOSIS — I10 Essential (primary) hypertension: Secondary | ICD-10-CM

## 2016-08-30 NOTE — Progress Notes (Signed)
Patient here for lab visit only 

## 2016-08-31 LAB — COMPLETE METABOLIC PANEL WITH GFR
ALT: 8 U/L (ref 6–29)
AST: 11 U/L (ref 10–35)
Albumin: 4 g/dL (ref 3.6–5.1)
Alkaline Phosphatase: 70 U/L (ref 33–130)
BUN: 15 mg/dL (ref 7–25)
CHLORIDE: 103 mmol/L (ref 98–110)
CO2: 29 mmol/L (ref 20–31)
Calcium: 9.5 mg/dL (ref 8.6–10.4)
Creat: 1.02 mg/dL (ref 0.50–1.05)
GFR, EST AFRICAN AMERICAN: 71 mL/min (ref >=60)
GFR, EST NON AFRICAN AMERICAN: 61 mL/min (ref >=60)
Glucose, Bld: 91 mg/dL (ref 65–99)
Potassium: 4.3 mmol/L (ref 3.5–5.3)
Sodium: 139 mmol/L (ref 135–146)
Total Bilirubin: 0.3 mg/dL (ref 0.2–1.2)
Total Protein: 7.2 g/dL (ref 6.1–8.1)

## 2016-08-31 LAB — LIPID PANEL
Cholesterol: 159 mg/dL (ref 125–200)
HDL: 73 mg/dL (ref >=46)
LDL CALC: 71 mg/dL (ref <130)
TRIGLYCERIDES: 73 mg/dL (ref <150)
Total CHOL/HDL Ratio: 2.2 Ratio (ref <=5.0)
VLDL: 15 mg/dL (ref <30)

## 2016-09-01 ENCOUNTER — Encounter: Payer: Self-pay | Admitting: Neurology

## 2016-09-01 ENCOUNTER — Telehealth: Payer: Self-pay

## 2016-09-01 ENCOUNTER — Ambulatory Visit (INDEPENDENT_AMBULATORY_CARE_PROVIDER_SITE_OTHER): Payer: Medicare Other | Admitting: Neurology

## 2016-09-01 VITALS — BP 130/78 | HR 83 | Ht 61.0 in | Wt 217.2 lb

## 2016-09-01 DIAGNOSIS — G40319 Generalized idiopathic epilepsy and epileptic syndromes, intractable, without status epilepticus: Secondary | ICD-10-CM | POA: Diagnosis not present

## 2016-09-01 DIAGNOSIS — F32A Depression, unspecified: Secondary | ICD-10-CM

## 2016-09-01 DIAGNOSIS — F329 Major depressive disorder, single episode, unspecified: Secondary | ICD-10-CM | POA: Diagnosis not present

## 2016-09-01 DIAGNOSIS — F411 Generalized anxiety disorder: Secondary | ICD-10-CM | POA: Diagnosis not present

## 2016-09-01 DIAGNOSIS — R569 Unspecified convulsions: Secondary | ICD-10-CM

## 2016-09-01 MED ORDER — LAMOTRIGINE ER 200 MG PO TB24: ORAL_TABLET | ORAL | 11 refills | Status: DC

## 2016-09-01 MED ORDER — DIVALPROEX SODIUM ER 500 MG PO TB24: ORAL_TABLET | ORAL | 3 refills | Status: DC

## 2016-09-01 MED ORDER — CARBAMAZEPINE ER 400 MG PO TB12: ORAL_TABLET | ORAL | 3 refills | Status: DC

## 2016-09-01 NOTE — Patient Instructions (Signed)
1. Increase Lamotrigine ER to 200mg  a day. With your current bottle of Lamotrigine ER 25mg , take 4 tablets daily. Once finished, your new bottle will be for Lamotrigine ER 200mg , take 1 tablet daily.  2. Continue all your other medications 3. Follow-up in 4 months, call for any changes  Seizure Precautions: 1. If medication has been prescribed for you to prevent seizures, take it exactly as directed.  Do not stop taking the medicine without talking to your doctor first, even if you have not had a seizure in a long time.   2. Avoid activities in which a seizure would cause danger to yourself or to others.  Don't operate dangerous machinery, swim alone, or climb in high or dangerous places, such as on ladders, roofs, or girders.  Do not drive unless your doctor says you may.  3. If you have any warning that you may have a seizure, lay down in a safe place where you can't hurt yourself.    4.  No driving for 6 months from last seizure, as per [facility].   Please refer to the following link on the [place] website for more information: http://[email or web address]/[email or web address]   5.  Maintain good sleep hygiene. Avoid alcohol.  6.  Contact your doctor if you have any problems that may be related to the medicine you are taking.  7.  Call 911 and bring the patient back to the ED if:        A.  The seizure lasts longer than 5 minutes.       B.  The patient doesn't awaken shortly after the seizure  C.  The patient has new problems such as difficulty seeing, speaking or moving  D.  The patient was injured during the seizure  E.  The patient has a temperature over 102 F (39C)  F.  The patient vomited and now is having trouble breathing

## 2016-09-01 NOTE — Telephone Encounter (Signed)
Writer called patient per Dr. Jarold Song and discussed lab results.  Patient stated understanding.

## 2016-09-01 NOTE — Telephone Encounter (Signed)
-----   Message from Arnoldo Morale, MD sent at 08/31/2016  2:14 PM EDT ----- Please inform the patient that labs are normal. Thank you.

## 2016-09-01 NOTE — Progress Notes (Signed)
NEUROLOGY FOLLOW UP OFFICE NOTE  Amanda Roach [id number]  HISTORY OF PRESENT ILLNESS: I had the pleasure of seeing Amanda Roach in follow-up in the neurology clinic on 09/01/2016.  The patient was last seen 7 months ago for seizures and memory loss. EEG consistent with primary generalized epilepsy with frequent 1-5 second bursts of generalized discharges. On her last visit, she reported increased seizures due to difficulties obtaining her medication. She is currently on Lamictal XR 50mg  3 tabs daily, Depakote ER 1000mg /day and Tegretol XR 800mg /day. Now that she has been getting them regularly, she denies having daily seizures, she usually has them 1-2 times a week, it usually only happens one day out of the week in the evening when she would have a "hard shake," then 3 small ones. Later on that week she will have one milder one. She has noticed them more when she is stressed and tired. She usually gets 8 hours of sleep. Her mood is pretty good, she continues to see her psychiatrist and therapist. Seroquel dose was increased but she feels sleepy on this and will be seeing her psychiatrist on Friday. She denies any dizziness, diplopia, dysarthria, focal numbness/tingling/weakness, no falls.   02/06/15: Lamictal level 3.9, Depakote level 50.1  HPI: This is a 60 yo RH woman with a history of hypertension, depression, and seizures since age 60 or 55, presenting for worsening memory and continued seizures. She and her sister report seizures started at age 60 or 60, she has "petit" ones and generalized convulsions. The "petit" seizures consist of staring and unresponsiveness with slight shaking of the head lasting 1 minute, sometimes occurring in clusters of 3 ro 4 within a 3 hour interval. They deny any lip smacking, dystonic posturing, or automatisms. She has had urinary incontinence and some tongue bite with these. She knows when she is having one, she feels her head turning to the right but cannot  control it, loses focus, forgetting what she was doing, followed by generalized weakness. She lives alone and reports having the petit ones 2-3 times daily. She denies any generalized convulsions in at least 20 years. Her sister describes right head version with some of the convulsions. She denies any gustatory hallucinations, deja vu, rising epigastric sensation, focal numbness/tingling/weakness. She has had body jerks since childhood. She has told her sister she has smelled "poop" several times.   She and her sister report that she has always had memory problems as a child, but this has greatly increased in the past 20 years. She would say something that didn't happen, or forget things she was supposed to do. Her sister is concerned about how she had given her phone number to strangers and could not recall if she had given them the wrong number. She lives alone but family has stayed in contact with her daily. She would get paranoid if they do not call her. She has been working with her therapist on this, and has a schedule book. She reports that her mood is really good some days, but other days "it's just no good," she sits and is "totally depressed." She can get very angry and violent at times. She denies any suicidal ideation. She takes Seroquel at bedtime.  Epilepsy Risk Factors: Her father and 2 sisters had seizures in childhood. Otherwise she had a normal birth and early development. There is no history of febrile convulsions, CNS infections such as meningitis/encephalitis, significant traumatic brain injury, neurosurgical procedures  Prior AEDs: She recalls trying Dilantin  and Phenobarbital when younger.   Diagnostic Data: Routine EEG was abnormal with occasional focal slowing over the left posterior temporal region, as well as frequent bursts of generalized high voltage irregular 4 Hz spike and wave discharges with frontal predominance lasting 1-4 seconds predominantly in drowsiness and sleep,  consistent with a primary generalized epilepsy.  48-hour EEG showed similar occasional focal slowing over the left temporal region, and frequent generalized 4-5 Hz spike and polyspike and wave discharges lasting 1-5 seconds. There was shifting lead-in noted over the left and right frontal regions, raising the possibility of secondary bisynchrony. She did not write her seizures down in the diary, but reports she had a lot. It is still unclear if the epileptiform discharges lasting more than 3 seconds are symptomatic as she did not write down the times of her seizures. There were no prolonged discharges lasting more than 10 seconds seen.  MRI brain with and without contrast 09/2014 did not show any acute changes, hippocampi symmetric with no abnormal signal or enhancement. There was mild diffuse atrophy, mild chronic microvascular changes noted.   Last levels: 02/06/15 Lamictal 3.9 (on Lamictal 100mg /day), Depakote 50.1  PAST MEDICAL HISTORY: Past Medical History:  Diagnosis Date  . Anxiety   . Asthma   . Depression   . Hyperlipidemia   . Hypertension   . Knee pain   . Seizure ([place])   . Seizures ([place])     MEDICATIONS: Current Outpatient Prescriptions on File Prior to Visit  Medication Sig Dispense Refill  . atorvastatin (LIPITOR) 10 MG tablet Take 1 tablet (10 mg total) by mouth daily. 90 tablet 1  . carbamazepine (TEGRETOL XR) 400 MG 12 hr tablet Take 2 tablets every morning 180 tablet 3  . divalproex (DEPAKOTE ER) 500 MG 24 hr tablet Take 2 tablets at bedtime 180 tablet 3  . gabapentin (NEURONTIN) 300 MG capsule Take 1 capsule (300 mg total) by mouth 2 (two) times daily. 60 capsule 2  . HYDROcodone-acetaminophen (NORCO/VICODIN) 5-325 MG tablet Take 1-2 tablets by mouth every 6 (six) hours as needed. (Patient not taking: Reported on 08/17/2016) 15 tablet 0  . LamoTRIgine (LAMICTAL XR) 50 MG TB24 Take 3 tablets daily 270 tablet 1  . methocarbamol (ROBAXIN) 500 MG tablet Take 1 tablet  (500 mg total) by mouth 2 (two) times daily as needed for muscle spasms. 60 tablet 1  . olmesartan-hydrochlorothiazide (BENICAR HCT) 20-12.5 MG tablet Take 1 tablet by mouth daily. 90 tablet 1  . QUEtiapine (SEROQUEL) 400 MG tablet Take 400 mg by mouth at bedtime.     No current facility-administered medications on file prior to visit.     ALLERGIES: Allergies  Allergen Reactions  . Naproxen Nausea Only    FAMILY HISTORY: Family History  Problem Relation Age of Onset  . Heart disease Mother     SOCIAL HISTORY: Social History   Social History  . Marital status: Divorced    Spouse name: N/A  . Number of children: N/A  . Years of education: N/A   Occupational History  . Not on file.   Social History Main Topics  . Smoking status: Never Smoker  . Smokeless tobacco: Never Used  . Alcohol use No  . Drug use: No  . Sexual activity: Not on file   Other Topics Concern  . Not on file   Social History Narrative  . No narrative on file    REVIEW OF SYSTEMS: Constitutional: No fevers, chills, or sweats, no generalized fatigue, change in  appetite Eyes: No visual changes, double vision, eye pain Ear, nose and throat: No hearing loss, ear pain, nasal congestion, sore throat Cardiovascular: No chest pain, palpitations Respiratory:  No shortness of breath at rest or with exertion, wheezes GastrointestinaI: No nausea, vomiting, diarrhea, abdominal pain, fecal incontinence Genitourinary:  No dysuria, urinary retention or frequency Musculoskeletal:  No neck pain, back pain Integumentary: No rash, pruritus, skin lesions Neurological: as above Psychiatric: + depression, insomnia, anxiety Endocrine: No palpitations, fatigue, diaphoresis, mood swings, change in appetite, change in weight, increased thirst Hematologic/Lymphatic:  No anemia, purpura, petechiae. Allergic/Immunologic: no itchy/runny eyes, nasal congestion, recent allergic reactions, rashes  PHYSICAL EXAM: Vitals:    09/01/16 0837  BP: 130/78  Pulse: 83   General: No acute distress Head:  Normocephalic/atraumatic Neck: supple, no paraspinal tenderness, full range of motion Heart:  Regular rate and rhythm Lungs:  Clear to auscultation bilaterally Back: No paraspinal tenderness Skin/Extremities: No rash, no edema Neurological Exam: alert and oriented to person, place, and time. No aphasia or dysarthria. Fund of knowledge is appropriate.  Recent and remote memory are intact. 2/3 delayed recall. Attention and concentration are normal.    Able to name objects and repeat phrases. Cranial nerves: Pupils equal, round, reactive to light.  Extraocular movements intact with no nystagmus. Visual fields full. Facial sensation intact. No facial asymmetry. Tongue, uvula, palate midline.  Motor: Bulk and tone normal, muscle strength 5/5 throughout with no pronator drift.  Sensation to light touch intact.  No extinction to double simultaneous stimulation.  Deep tendon reflexes 2+ throughout, toes downgoing.  Finger to nose testing intact.  Gait narrow-based and steady, able to tandem walk adequately.  Romberg negative.  IMPRESSION: This is a 60 yo RH woman with a history of seizures since childhood with episodes of staring and unresponsiveness with head twitch to the right, and infrequent generalized convulsions, no convulsions in at least 20 years. She had presented with worsening memory and multiple daily seizures. 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 MRI brain unremarkable. She had reported a significant improvement in seizure frequency with Lamictal 150mg /day, currently reporting around 1-2 small seizures a week. She will increase dose to 200mg  daily. Continue Depakote ER 1000mg /day and Tegretol XR  800mg /day. Continue follow-up with psychiatry and therapy. She does not drive and understands [place] driving laws. She will follow-up in 4 months and knows to call our office for any changes.  Thank you for allowing me to participate in her care.  Please do not hesitate to call for any questions or concerns.  The duration of this appointment visit was 15 minutes of face-to-face time with the patient.  Greater than 50% of this time was spent in counseling, explanation of diagnosis, planning of further management, and coordination of care.   Amanda Roach, M.D.   CC: Dr. Doreene Burke

## 2016-10-07 ENCOUNTER — Other Ambulatory Visit: Payer: Self-pay | Admitting: Pharmacist

## 2016-10-07 DIAGNOSIS — I10 Essential (primary) hypertension: Secondary | ICD-10-CM

## 2016-10-07 MED ORDER — OLMESARTAN MEDOXOMIL-HCTZ 20-12.5 MG PO TABS: 1.0000 | ORAL_TABLET | Freq: Every day | ORAL | 0 refills | Status: AC

## 2016-11-17 ENCOUNTER — Ambulatory Visit: Payer: Medicare Other | Attending: Internal Medicine | Admitting: Internal Medicine

## 2016-11-17 VITALS — BP 126/84 | HR 69 | Temp 98.9°F | Resp 16

## 2016-11-17 DIAGNOSIS — I10 Essential (primary) hypertension: Secondary | ICD-10-CM | POA: Insufficient documentation

## 2016-11-17 DIAGNOSIS — F419 Anxiety disorder, unspecified: Secondary | ICD-10-CM | POA: Insufficient documentation

## 2016-11-17 DIAGNOSIS — Z79899 Other long term (current) drug therapy: Secondary | ICD-10-CM | POA: Insufficient documentation

## 2016-11-17 DIAGNOSIS — J45909 Unspecified asthma, uncomplicated: Secondary | ICD-10-CM | POA: Insufficient documentation

## 2016-11-17 DIAGNOSIS — B349 Viral infection, unspecified: Secondary | ICD-10-CM

## 2016-11-17 DIAGNOSIS — E785 Hyperlipidemia, unspecified: Secondary | ICD-10-CM | POA: Insufficient documentation

## 2016-11-17 DIAGNOSIS — Z888 Allergy status to other drugs, medicaments and biological substances status: Secondary | ICD-10-CM | POA: Insufficient documentation

## 2016-11-17 DIAGNOSIS — F329 Major depressive disorder, single episode, unspecified: Secondary | ICD-10-CM | POA: Insufficient documentation

## 2016-11-17 DIAGNOSIS — J069 Acute upper respiratory infection, unspecified: Secondary | ICD-10-CM | POA: Insufficient documentation

## 2016-11-17 MED ORDER — BENZONATATE 100 MG PO CAPS: 100.0000 mg | ORAL_CAPSULE | Freq: Three times a day (TID) | ORAL | 0 refills | Status: DC | PRN

## 2016-11-17 MED ORDER — GUAIFENESIN-CODEINE 100-10 MG/5ML PO SOLN: 5.0000 mL | Freq: Four times a day (QID) | ORAL | 0 refills | Status: DC | PRN

## 2016-11-17 NOTE — Patient Instructions (Signed)
Upper Respiratory Infection, Adult Most upper respiratory infections (URIs) are caused by a virus. A URI affects the nose, throat, and upper air passages. The most common type of URI is often called "the common cold." Follow these instructions at home:  Take medicines only as told by your doctor.  Gargle warm saltwater or take cough drops to comfort your throat as told by your doctor.  Use a warm mist humidifier or inhale steam from a shower to increase air moisture. This may make it easier to breathe.  Drink enough fluid to keep your pee (urine) clear or pale yellow.  Eat soups and other clear broths.  Have a healthy diet.  Rest as needed.  Go back to work when your fever is gone or your doctor says it is okay.  You may need to stay home longer to avoid giving your URI to others.  You can also wear a face mask and wash your hands often to prevent spread of the virus.  Use your inhaler more if you have asthma.  Do not use any tobacco products, including cigarettes, chewing tobacco, or electronic cigarettes. If you need help quitting, ask your doctor. Contact a doctor if:  You are getting worse, not better.  Your symptoms are not helped by medicine.  You have chills.  You are getting more short of breath.  You have brown or red mucus.  You have yellow or brown discharge from your nose.  You have pain in your face, especially when you bend forward.  You have a fever.  You have puffy (swollen) neck glands.  You have pain while swallowing.  You have white areas in the back of your throat. Get help right away if:  You have very bad or constant:  Headache.  Ear pain.  Pain in your forehead, behind your eyes, and over your cheekbones (sinus pain).  Chest pain.  You have long-lasting (chronic) lung disease and any of the following:  Wheezing.  Long-lasting cough.  Coughing up blood.  A change in your usual mucus.  You have a stiff neck.  You have  changes in your:  Vision.  Hearing.  Thinking.  Mood. This information is not intended to replace advice given to you by your health care provider. Make sure you discuss any questions you have with your health care provider. Document Released: 04/12/2008 Document Revised: 06/27/2016 Document Reviewed: 01/30/2014 Elsevier Interactive Patient Education  2017 [employer] Inc[employer]  

## 2016-11-17 NOTE — Progress Notes (Signed)
Amanda Roach, is a 61 y.o. female  [id number]  [id number]  DOB - 1956-02-18  Chief Complaint  Patient presents with  . URI        Subjective:   Amanda Roach is a 61 y.o. female here today for a urgent care visit for uri sx x 1 wk, w/ associated deep productive cough, green/dark tinged at times, tired/fatigue/ha around temporal regions, chest pain w/ cough. No f/c. Does not smoke, but around smokers.  Did not get flu vac this year.  Ears stop up sometimes, Nausea, but no emesis except last week x 1.  Denies diarrhea.    No problems updated.  ALLERGIES: Allergies  Allergen Reactions  . Naproxen Nausea Only    PAST MEDICAL HISTORY: Past Medical History:  Diagnosis Date  . Anxiety   . Asthma   . Depression   . Hyperlipidemia   . Hypertension   . Knee pain   . Seizure ([place])   . Seizures ([place])     MEDICATIONS AT HOME: Prior to Admission medications   Medication Sig Start Date End Date Taking? Authorizing Provider  atorvastatin (LIPITOR) 10 MG tablet Take 1 tablet (10 mg total) by mouth daily. 08/17/16   Arnoldo Morale, MD  benzonatate (TESSALON PERLES) 100 MG capsule Take 1 capsule (100 mg total) by mouth 3 (three) times daily as needed for cough. 11/17/16   Maren Reamer, MD  carbamazepine (TEGRETOL XR) 400 MG 12 hr tablet Take 2 tablets every morning 09/01/16   Cameron Sprang, MD  divalproex (DEPAKOTE ER) 500 MG 24 hr tablet Take 2 tablets at bedtime 09/01/16   Cameron Sprang, MD  gabapentin (NEURONTIN) 300 MG capsule Take 1 capsule (300 mg total) by mouth 2 (two) times daily. 08/17/16   Arnoldo Morale, MD  guaiFENesin-codeine 100-10 MG/5ML syrup Take 5 mLs by mouth every 6 (six) hours as needed for cough. For severe cough, do not drive with this. 11/17/16   Maren Reamer, MD  HYDROcodone-acetaminophen (NORCO/VICODIN) 5-325 MG tablet Take 1-2 tablets by mouth every 6 (six) hours as needed. 01/09/16   Montine Circle, PA-C  LamoTRIgine XR 200 MG TB24  Take 1 tablet daily 09/01/16   Cameron Sprang, MD  methocarbamol (ROBAXIN) 500 MG tablet Take 1 tablet (500 mg total) by mouth 2 (two) times daily as needed for muscle spasms. 08/17/16   Arnoldo Morale, MD  olmesartan-hydrochlorothiazide (BENICAR HCT) 20-12.5 MG tablet Take 1 tablet by mouth daily. 10/07/16   Tresa Garter, MD  QUEtiapine (SEROQUEL) 400 MG tablet Take 400 mg by mouth at bedtime.    Historical Provider, MD     Objective:   Vitals:   11/17/16 1048  BP: 126/84  Pulse: 69  Resp: 16  Temp: 98.9 F (37.2 C)  TempSrc: Oral  SpO2: 96%    Exam General appearance : Awake, alert, not in any distress. Speech Clear. Not toxic looking, but tired appearing, obese. HEENT: Atraumatic and Normocephalic, pupils equally reactive to light.  No conjuctivitis, bilat TMS clear, no irritation bilat ear canals. Boggy naires, but no exudate noted. No enlarged tonsils, erythema or exudate noted on exam. Neck: supple, no JVD. No cervical lymphadenopathy.  Chest:Good air entry bilaterally, no added sounds.  No w/c/r CVS: S1 S2 regular, no murmurs/gallups or rubs. Abdomen: Bowel sounds active, obese, Non tender and not distended with no gaurding, rigidity or rebound. Extremities: B/L Lower Ext shows no edema, both legs are warm to touch Neurology: Awake alert,  and oriented X 3, CN II-XII grossly intact, Non focal Skin:No Rash  Data Review Lab Results  Component Value Date   HGBA1C 5.6 07/01/2014    Depression screen [facility] 2/9 06/19/2015 05/07/2015 04/21/2015 03/05/2015 01/16/2015  Decreased Interest 2 0 1 0 0  Down, Depressed, Hopeless 1 0 2 0 0  PHQ - 2 Score 3 0 3 0 0  Altered sleeping 2 0 2 - -  Tired, decreased energy 1 0 1 - -  Change in appetite 2 0 2 - -  Feeling bad or failure about yourself  2 0 0 - -  Trouble concentrating 2 0 1 - -  Moving slowly or fidgety/restless 2 0 0 - -  Suicidal thoughts 0 0 0 - -  PHQ-9 Score 14 0 9 - -      Assessment & Plan   1. Acute viral  syndrome Suspect viral syndrome, too late for emp tamiflu. Encouraged to stay hydrated, supportive trx. - prn tessalon for cough - prn guifenison/codeine for severe cough at night. - no abx indicated at this time - rec f/u on 1-2 wks if no improvement and worsening sxs.  2. Acute upper respiratory infection See #1     Patient have been counseled extensively about nutrition and exercise  Return in about 3 months (around 02/15/2017), or if symptoms worsen or fail to improve, for fu in 1-2 wks if worse sx..  The patient was given clear instructions to go to ER or return to medical center if symptoms don't improve, worsen or new problems develop. The patient verbalized understanding. The patient was told to call to get lab results if they haven't heard anything in the next week.   This note has been created with Surveyor, quantity. Any transcriptional errors are unintentional.   Maren Reamer, MD, [place] and [facility] [place], [place]   11/17/2016, 10:56 AM

## 2016-12-06 DIAGNOSIS — J302 Other seasonal allergic rhinitis: Secondary | ICD-10-CM | POA: Diagnosis not present

## 2016-12-06 DIAGNOSIS — Z79899 Other long term (current) drug therapy: Secondary | ICD-10-CM | POA: Diagnosis not present

## 2016-12-06 DIAGNOSIS — Z13220 Encounter for screening for lipoid disorders: Secondary | ICD-10-CM | POA: Diagnosis not present

## 2016-12-06 DIAGNOSIS — E559 Vitamin D deficiency, unspecified: Secondary | ICD-10-CM | POA: Diagnosis not present

## 2016-12-06 DIAGNOSIS — Z131 Encounter for screening for diabetes mellitus: Secondary | ICD-10-CM | POA: Diagnosis not present

## 2016-12-06 DIAGNOSIS — K30 Functional dyspepsia: Secondary | ICD-10-CM | POA: Diagnosis not present

## 2016-12-06 DIAGNOSIS — R569 Unspecified convulsions: Secondary | ICD-10-CM | POA: Diagnosis not present

## 2016-12-06 DIAGNOSIS — I10 Essential (primary) hypertension: Secondary | ICD-10-CM | POA: Diagnosis not present

## 2016-12-13 ENCOUNTER — Ambulatory Visit: Payer: Self-pay | Admitting: Family Medicine

## 2016-12-16 DIAGNOSIS — R1013 Epigastric pain: Secondary | ICD-10-CM | POA: Diagnosis not present

## 2016-12-22 DIAGNOSIS — R1312 Dysphagia, oropharyngeal phase: Secondary | ICD-10-CM | POA: Diagnosis not present

## 2016-12-22 DIAGNOSIS — I10 Essential (primary) hypertension: Secondary | ICD-10-CM | POA: Diagnosis not present

## 2016-12-22 DIAGNOSIS — E2839 Other primary ovarian failure: Secondary | ICD-10-CM | POA: Diagnosis not present

## 2016-12-22 DIAGNOSIS — R569 Unspecified convulsions: Secondary | ICD-10-CM | POA: Diagnosis not present

## 2016-12-22 DIAGNOSIS — Z1239 Encounter for other screening for malignant neoplasm of breast: Secondary | ICD-10-CM | POA: Diagnosis not present

## 2016-12-22 DIAGNOSIS — Z23 Encounter for immunization: Secondary | ICD-10-CM | POA: Diagnosis not present

## 2016-12-22 DIAGNOSIS — E559 Vitamin D deficiency, unspecified: Secondary | ICD-10-CM | POA: Diagnosis not present

## 2016-12-24 ENCOUNTER — Other Ambulatory Visit: Payer: Self-pay | Admitting: Internal Medicine

## 2016-12-24 DIAGNOSIS — Z1231 Encounter for screening mammogram for malignant neoplasm of breast: Secondary | ICD-10-CM

## 2016-12-27 ENCOUNTER — Other Ambulatory Visit: Payer: Self-pay | Admitting: Internal Medicine

## 2016-12-27 DIAGNOSIS — E2839 Other primary ovarian failure: Secondary | ICD-10-CM

## 2016-12-29 ENCOUNTER — Ambulatory Visit (INDEPENDENT_AMBULATORY_CARE_PROVIDER_SITE_OTHER): Payer: Medicare Other | Admitting: Neurology

## 2016-12-29 ENCOUNTER — Encounter: Payer: Self-pay | Admitting: Neurology

## 2016-12-29 VITALS — BP 130/78 | HR 78 | Ht 61.0 in | Wt 201.2 lb

## 2016-12-29 DIAGNOSIS — R569 Unspecified convulsions: Secondary | ICD-10-CM

## 2016-12-29 DIAGNOSIS — G40319 Generalized idiopathic epilepsy and epileptic syndromes, intractable, without status epilepticus: Secondary | ICD-10-CM | POA: Diagnosis not present

## 2016-12-29 MED ORDER — LAMOTRIGINE ER 200 MG PO TB24: ORAL_TABLET | ORAL | 3 refills | Status: DC

## 2016-12-29 MED ORDER — CARBAMAZEPINE ER 400 MG PO TB12: ORAL_TABLET | ORAL | 3 refills | Status: DC

## 2016-12-29 MED ORDER — DIVALPROEX SODIUM ER 500 MG PO TB24: ORAL_TABLET | ORAL | 3 refills | Status: DC

## 2016-12-29 NOTE — Progress Notes (Signed)
NEUROLOGY FOLLOW UP OFFICE NOTE  Amanda Roach JM:[id number]  HISTORY OF PRESENT ILLNESS: I had the pleasure of seeing Amanda Roach in follow-up in the neurology clinic on 12/29/2016.  The patient was last seen 4 months ago for seizures and memory loss. EEG consistent with primary generalized epilepsy with frequent 1-5 second bursts of generalized discharges. On her last visit, she reported an improvement with seizure frequency on higher dose of Lamictal to around 1-2 times a week. Dose increased to 200mg  daily. She is also taking Depakote ER 1000mg /day and Tegretol XR 800mg /day. She reported that seizures were "much better" on higher dose of Lamictal. However, she had been sick since December with flu-like symptoms and bronchitis, GI issues, and noticed the seizures were more frequent. When she was very sick, seizures were occurring daily. These are quieting now that she is feeling better. Last seizure was yesterday. No convulsions. She denies any dizziness, diplopia, dysarthria, focal numbness/tingling/weakness, no falls.   02/06/15: Lamictal level 3.9, Depakote level 50.1  HPI: This is a 61 yo RH woman with a history of hypertension, depression, and seizures since age 24 or 67, presenting for worsening memory and continued seizures. She and her sister report seizures started at age 84 or 10, she has "petit" ones and generalized convulsions. The "petit" seizures consist of staring and unresponsiveness with slight shaking of the head lasting 1 minute, sometimes occurring in clusters of 3 ro 4 within a 3 hour interval. They deny any lip smacking, dystonic posturing, or automatisms. She has had urinary incontinence and some tongue bite with these. She knows when she is having one, she feels her head turning to the right but cannot control it, loses focus, forgetting what she was doing, followed by generalized weakness. She lives alone and reports having the petit ones 2-3 times daily. She denies any  generalized convulsions in at least 20 years. Her sister describes right head version with some of the convulsions. She denies any gustatory hallucinations, deja vu, rising epigastric sensation, focal numbness/tingling/weakness. She has had body jerks since childhood. She has told her sister she has smelled "poop" several times.   She and her sister report that she has always had memory problems as a child, but this has greatly increased in the past 20 years. She would say something that didn't happen, or forget things she was supposed to do. Her sister is concerned about how she had given her phone number to strangers and could not recall if she had given them the wrong number. She lives alone but family has stayed in contact with her daily. She would get paranoid if they do not call her. She has been working with her therapist on this, and has a schedule book. She reports that her mood is really good some days, but other days "it's just no good," she sits and is "totally depressed." She can get very angry and violent at times. She denies any suicidal ideation. She takes Seroquel at bedtime.  Epilepsy Risk Factors: Her father and 2 sisters had seizures in childhood. Otherwise she had a normal birth and early development. There is no history of febrile convulsions, CNS infections such as meningitis/encephalitis, significant traumatic brain injury, neurosurgical procedures  Prior AEDs: She recalls trying Dilantin and Phenobarbital when younger.   Diagnostic Data: Routine EEG was abnormal with occasional focal slowing over the left posterior temporal region, as well as frequent bursts of generalized high voltage irregular 4 Hz spike and wave discharges with frontal  predominance lasting 1-4 seconds predominantly in drowsiness and sleep, consistent with a primary generalized epilepsy.  48-hour EEG showed similar occasional focal slowing over the left temporal region, and frequent generalized 4-5 Hz spike  and polyspike and wave discharges lasting 1-5 seconds. There was shifting lead-in noted over the left and right frontal regions, raising the possibility of secondary bisynchrony. She did not write her seizures down in the diary, but reports she had a lot. It is still unclear if the epileptiform discharges lasting more than 3 seconds are symptomatic as she did not write down the times of her seizures. There were no prolonged discharges lasting more than 10 seconds seen.  MRI brain with and without contrast 09/2014 did not show any acute changes, hippocampi symmetric with no abnormal signal or enhancement. There was mild diffuse atrophy, mild chronic microvascular changes noted.   Last levels: 02/06/15 Lamictal 3.9 (on Lamictal 100mg /day), Depakote 50.1  PAST MEDICAL HISTORY: Past Medical History:  Diagnosis Date  . Anxiety   . Asthma   . Depression   . Hyperlipidemia   . Hypertension   . Knee pain   . Seizure ([place])   . Seizures ([place])     MEDICATIONS: Current Outpatient Prescriptions on File Prior to Visit  Medication Sig Dispense Refill  . atorvastatin (LIPITOR) 10 MG tablet Take 1 tablet (10 mg total) by mouth daily. 90 tablet 1  . benzonatate (TESSALON PERLES) 100 MG capsule Take 1 capsule (100 mg total) by mouth 3 (three) times daily as needed for cough. 30 capsule 0  . carbamazepine (TEGRETOL XR) 400 MG 12 hr tablet Take 2 tablets every morning 180 tablet 3  . divalproex (DEPAKOTE ER) 500 MG 24 hr tablet Take 2 tablets at bedtime 180 tablet 3  . gabapentin (NEURONTIN) 300 MG capsule Take 1 capsule (300 mg total) by mouth 2 (two) times daily. 60 capsule 2  . guaiFENesin-codeine 100-10 MG/5ML syrup Take 5 mLs by mouth every 6 (six) hours as needed for cough. For severe cough, do not drive with this. 120 mL 0  . HYDROcodone-acetaminophen (NORCO/VICODIN) 5-325 MG tablet Take 1-2 tablets by mouth every 6 (six) hours as needed. 15 tablet 0  . LamoTRIgine XR 200 MG TB24 Take 1 tablet daily  30 tablet 11  . methocarbamol (ROBAXIN) 500 MG tablet Take 1 tablet (500 mg total) by mouth 2 (two) times daily as needed for muscle spasms. 60 tablet 1  . olmesartan-hydrochlorothiazide (BENICAR HCT) 20-12.5 MG tablet Take 1 tablet by mouth daily. 90 tablet 0  . QUEtiapine (SEROQUEL) 400 MG tablet Take 400 mg by mouth at bedtime.     No current facility-administered medications on file prior to visit.     ALLERGIES: Allergies  Allergen Reactions  . Naproxen Nausea Only    FAMILY HISTORY: Family History  Problem Relation Age of Onset  . Heart disease Mother     SOCIAL HISTORY: Social History   Social History  . Marital status: Divorced    Spouse name: N/A  . Number of children: N/A  . Years of education: N/A   Occupational History  . Not on file.   Social History Main Topics  . Smoking status: Never Smoker  . Smokeless tobacco: Never Used  . Alcohol use No  . Drug use: No  . Sexual activity: Not on file   Other Topics Concern  . Not on file   Social History Narrative  . No narrative on file    REVIEW OF SYSTEMS: Constitutional:  No fevers, chills, or sweats, no generalized fatigue, change in appetite Eyes: No visual changes, double vision, eye pain Ear, nose and throat: No hearing loss, ear pain, nasal congestion, sore throat Cardiovascular: No chest pain, palpitations Respiratory:  No shortness of breath at rest or with exertion, wheezes GastrointestinaI: No nausea, vomiting, diarrhea, abdominal pain, fecal incontinence Genitourinary:  No dysuria, urinary retention or frequency Musculoskeletal:  No neck pain, back pain Integumentary: No rash, pruritus, skin lesions Neurological: as above Psychiatric: + depression, insomnia, anxiety Endocrine: No palpitations, fatigue, diaphoresis, mood swings, change in appetite, change in weight, increased thirst Hematologic/Lymphatic:  No anemia, purpura, petechiae. Allergic/Immunologic: no itchy/runny eyes, nasal  congestion, recent allergic reactions, rashes  PHYSICAL EXAM: Vitals:   12/29/16 0918  BP: 130/78  Pulse: 78   General: No acute distress Head:  Normocephalic/atraumatic Neck: supple, no paraspinal tenderness, full range of motion Heart:  Regular rate and rhythm Lungs:  Clear to auscultation bilaterally Back: No paraspinal tenderness Skin/Extremities: No rash, no edema Neurological Exam: alert and oriented to person, place, and time. No aphasia or dysarthria. Fund of knowledge is appropriate.  Recent and remote memory are intact. Attention and concentration are normal.    Able to name objects and repeat phrases. Cranial nerves: Pupils equal, round, reactive to light.  Extraocular movements intact with no nystagmus. Visual fields full. Facial sensation intact. No facial asymmetry. Tongue, uvula, palate midline.  Motor: Bulk and tone normal, muscle strength 5/5 throughout with no pronator drift.  Sensation to light touch intact.  No extinction to double simultaneous stimulation.  Deep tendon reflexes 2+ throughout, toes downgoing.  Finger to nose testing intact.  Gait narrow-based and steady, able to tandem walk adequately.  Romberg negative.  IMPRESSION: This is a 61 yo RH woman with a history of seizures since childhood with episodes of staring and unresponsiveness with head twitch to the right, and infrequent generalized convulsions, no convulsions in at least 20 years. She had presented with worsening memory and multiple daily seizures. 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 MRI brain unremarkable. She had reported a significant improvement in seizure frequency with Lamictal 200mg /day. She had an increase in small seizures while sick, this is quieting down as she  continues to feel better. Continue Depakote ER 1000mg /day, Tegretol XR 800mg /day, and Lamictal XR 200mg  daily.. Continue follow-up with psychiatry and therapy. She does not drive and understands [place] driving laws. She will follow-up in 5-6 months and knows to call our office for any changes.  Thank you for allowing me to participate in her care.  Please do not hesitate to call for any questions or concerns.  The duration of this appointment visit was 15 minutes of face-to-face time with the patient.  Greater than 50% of this time was spent in counseling, explanation of diagnosis, planning of further management, and coordination of care.   Ellouise Newer, M.D.

## 2016-12-29 NOTE — Patient Instructions (Signed)
1. Continue all your current medications 2. Feel better soon! 3. Follow-up in 5-6 months, call for any changes

## 2017-01-03 ENCOUNTER — Ambulatory Visit: Payer: Self-pay

## 2017-01-17 ENCOUNTER — Ambulatory Visit
Admission: RE | Admit: 2017-01-17 | Discharge: 2017-01-17 | Disposition: A | Discharge status: Home / Self Care | Payer: Medicare Other | Source: Ambulatory Visit | Attending: Internal Medicine | Admitting: Internal Medicine

## 2017-01-17 DIAGNOSIS — Z1231 Encounter for screening mammogram for malignant neoplasm of breast: Secondary | ICD-10-CM

## 2017-01-17 DIAGNOSIS — Z780 Asymptomatic menopausal state: Secondary | ICD-10-CM | POA: Diagnosis not present

## 2017-01-17 DIAGNOSIS — M85851 Other specified disorders of bone density and structure, right thigh: Secondary | ICD-10-CM | POA: Diagnosis not present

## 2017-01-17 DIAGNOSIS — E2839 Other primary ovarian failure: Secondary | ICD-10-CM

## 2017-01-21 DIAGNOSIS — R109 Unspecified abdominal pain: Secondary | ICD-10-CM | POA: Diagnosis not present

## 2017-01-21 DIAGNOSIS — R1310 Dysphagia, unspecified: Secondary | ICD-10-CM | POA: Diagnosis not present

## 2017-01-24 DIAGNOSIS — K449 Diaphragmatic hernia without obstruction or gangrene: Secondary | ICD-10-CM | POA: Diagnosis not present

## 2017-01-24 DIAGNOSIS — R1312 Dysphagia, oropharyngeal phase: Secondary | ICD-10-CM | POA: Diagnosis not present

## 2017-01-24 DIAGNOSIS — R569 Unspecified convulsions: Secondary | ICD-10-CM | POA: Diagnosis not present

## 2017-01-24 DIAGNOSIS — I10 Essential (primary) hypertension: Secondary | ICD-10-CM | POA: Diagnosis not present

## 2017-01-24 DIAGNOSIS — M899 Disorder of bone, unspecified: Secondary | ICD-10-CM | POA: Diagnosis not present

## 2017-01-24 DIAGNOSIS — K224 Dyskinesia of esophagus: Secondary | ICD-10-CM | POA: Diagnosis not present

## 2017-01-24 DIAGNOSIS — R1310 Dysphagia, unspecified: Secondary | ICD-10-CM | POA: Diagnosis not present

## 2017-01-24 DIAGNOSIS — F319 Bipolar disorder, unspecified: Secondary | ICD-10-CM | POA: Diagnosis not present

## 2017-02-02 DIAGNOSIS — K222 Esophageal obstruction: Secondary | ICD-10-CM | POA: Diagnosis not present

## 2017-02-02 DIAGNOSIS — I10 Essential (primary) hypertension: Secondary | ICD-10-CM | POA: Diagnosis not present

## 2017-02-02 DIAGNOSIS — F319 Bipolar disorder, unspecified: Secondary | ICD-10-CM | POA: Diagnosis not present

## 2017-02-02 DIAGNOSIS — K5900 Constipation, unspecified: Secondary | ICD-10-CM | POA: Diagnosis not present

## 2017-03-07 DIAGNOSIS — K222 Esophageal obstruction: Secondary | ICD-10-CM | POA: Diagnosis not present

## 2017-03-07 DIAGNOSIS — I10 Essential (primary) hypertension: Secondary | ICD-10-CM | POA: Diagnosis not present

## 2017-03-07 DIAGNOSIS — K5900 Constipation, unspecified: Secondary | ICD-10-CM | POA: Diagnosis not present

## 2017-03-07 DIAGNOSIS — M545 Low back pain: Secondary | ICD-10-CM | POA: Diagnosis not present

## 2017-03-07 DIAGNOSIS — M2578 Osteophyte, vertebrae: Secondary | ICD-10-CM | POA: Diagnosis not present

## 2017-03-07 DIAGNOSIS — R569 Unspecified convulsions: Secondary | ICD-10-CM | POA: Diagnosis not present

## 2017-03-07 DIAGNOSIS — M5136 Other intervertebral disc degeneration, lumbar region: Secondary | ICD-10-CM | POA: Diagnosis not present

## 2017-03-09 DIAGNOSIS — R1310 Dysphagia, unspecified: Secondary | ICD-10-CM | POA: Diagnosis not present

## 2017-03-09 DIAGNOSIS — K5901 Slow transit constipation: Secondary | ICD-10-CM | POA: Diagnosis not present

## 2017-05-30 ENCOUNTER — Ambulatory Visit: Payer: Medicare Other | Admitting: Neurology

## 2017-06-16 DIAGNOSIS — Z800 Family history of malignant neoplasm of digestive organs: Secondary | ICD-10-CM | POA: Diagnosis not present

## 2017-06-16 DIAGNOSIS — R1311 Dysphagia, oral phase: Secondary | ICD-10-CM | POA: Diagnosis not present

## 2017-06-16 DIAGNOSIS — K5901 Slow transit constipation: Secondary | ICD-10-CM | POA: Diagnosis not present

## 2017-07-01 DIAGNOSIS — I10 Essential (primary) hypertension: Secondary | ICD-10-CM | POA: Diagnosis not present

## 2017-07-01 DIAGNOSIS — R250 Abnormal head movements: Secondary | ICD-10-CM | POA: Diagnosis not present

## 2017-07-01 DIAGNOSIS — F319 Bipolar disorder, unspecified: Secondary | ICD-10-CM | POA: Diagnosis not present

## 2017-07-01 DIAGNOSIS — J302 Other seasonal allergic rhinitis: Secondary | ICD-10-CM | POA: Diagnosis not present

## 2017-07-01 DIAGNOSIS — E669 Obesity, unspecified: Secondary | ICD-10-CM | POA: Diagnosis not present

## 2017-08-29 DIAGNOSIS — F319 Bipolar disorder, unspecified: Secondary | ICD-10-CM | POA: Diagnosis not present

## 2017-08-29 DIAGNOSIS — Z6838 Body mass index (BMI) 38.0-38.9, adult: Secondary | ICD-10-CM | POA: Diagnosis not present

## 2017-08-29 DIAGNOSIS — Z23 Encounter for immunization: Secondary | ICD-10-CM | POA: Diagnosis not present

## 2017-08-29 DIAGNOSIS — I10 Essential (primary) hypertension: Secondary | ICD-10-CM | POA: Diagnosis not present

## 2017-08-29 DIAGNOSIS — E8770 Fluid overload, unspecified: Secondary | ICD-10-CM | POA: Diagnosis not present

## 2017-08-29 DIAGNOSIS — E669 Obesity, unspecified: Secondary | ICD-10-CM | POA: Diagnosis not present

## 2017-09-15 ENCOUNTER — Other Ambulatory Visit: Payer: Self-pay

## 2017-09-15 DIAGNOSIS — D126 Benign neoplasm of colon, unspecified: Secondary | ICD-10-CM | POA: Diagnosis not present

## 2017-09-15 DIAGNOSIS — G40319 Generalized idiopathic epilepsy and epileptic syndromes, intractable, without status epilepticus: Secondary | ICD-10-CM

## 2017-09-15 DIAGNOSIS — Z1211 Encounter for screening for malignant neoplasm of colon: Secondary | ICD-10-CM | POA: Diagnosis not present

## 2017-09-15 DIAGNOSIS — R569 Unspecified convulsions: Secondary | ICD-10-CM

## 2017-09-15 DIAGNOSIS — K5730 Diverticulosis of large intestine without perforation or abscess without bleeding: Secondary | ICD-10-CM | POA: Diagnosis not present

## 2017-09-15 DIAGNOSIS — Z800 Family history of malignant neoplasm of digestive organs: Secondary | ICD-10-CM | POA: Diagnosis not present

## 2017-09-20 DIAGNOSIS — D126 Benign neoplasm of colon, unspecified: Secondary | ICD-10-CM | POA: Diagnosis not present

## 2017-10-21 ENCOUNTER — Ambulatory Visit (INDEPENDENT_AMBULATORY_CARE_PROVIDER_SITE_OTHER): Payer: Medicare Other | Admitting: Neurology

## 2017-10-21 ENCOUNTER — Encounter: Payer: Self-pay | Admitting: Neurology

## 2017-10-21 VITALS — BP 112/70 | HR 81 | Ht 61.0 in | Wt 215.0 lb

## 2017-10-21 DIAGNOSIS — R569 Unspecified convulsions: Secondary | ICD-10-CM

## 2017-10-21 DIAGNOSIS — G40319 Generalized idiopathic epilepsy and epileptic syndromes, intractable, without status epilepticus: Secondary | ICD-10-CM | POA: Diagnosis not present

## 2017-10-21 MED ORDER — LAMOTRIGINE ER 200 MG PO TB24: ORAL_TABLET | ORAL | 3 refills | Status: DC

## 2017-10-21 MED ORDER — DIVALPROEX SODIUM ER 500 MG PO TB24: ORAL_TABLET | ORAL | 3 refills | Status: DC

## 2017-10-21 MED ORDER — CARBAMAZEPINE ER 400 MG PO TB12: ORAL_TABLET | ORAL | 3 refills | Status: DC

## 2017-10-21 NOTE — Patient Instructions (Addendum)
Good seeing you! Continue Lamictal XR 200mg  daily, Depakote ER 500mg  2 tablets at night, and Tegretol XR 400mg  2 tablets every morning. Follow-up in 10 months, have a good holiday! Call for any problems.  Seizure Precautions: 1. If medication has been prescribed for you to prevent seizures, take it exactly as directed.  Do not stop taking the medicine without talking to your doctor first, even if you have not had a seizure in a long time.   2. Avoid activities in which a seizure would cause danger to yourself or to others.  Don't operate dangerous machinery, swim alone, or climb in high or dangerous places, such as on ladders, roofs, or girders.  Do not drive unless your doctor says you may.  3. If you have any warning that you may have a seizure, lay down in a safe place where you can't hurt yourself.    4.  No driving for 6 months from last seizure, as per [facility].   Please refer to the following link on the [facility] website for more information: [email or web address]   5.  Maintain good sleep hygiene. Avoid alcohol.  6.  Contact your doctor if you have any problems that may be related to the medicine you are taking.  7.  Call 911 and bring the patient back to the ED if:        A.  The seizure lasts longer than 5 minutes.       B.  The patient doesn't awaken shortly after the seizure  C.  The patient has new problems such as difficulty seeing, speaking or moving  D.  The patient was injured during the seizure  E.  The patient has a temperature over 102 F (39C)  F.  The patient vomited and now is having trouble breathing

## 2017-10-21 NOTE — Progress Notes (Signed)
NEUROLOGY FOLLOW UP OFFICE NOTE  Amanda Roach [id number]  HISTORY OF PRESENT ILLNESS: I had the pleasure of seeing Amanda Roach in follow-up in the neurology clinic on 10/21/2017.  The patient was last seen 10 months ago for seizures and memory loss. EEG consistent with primary generalized epilepsy with frequent 1-5 second bursts of generalized discharges. Since her last visit, she reports that she is doing great. She is visibly in better spirits today compared to prior visits. She reports doing well psychologically, her thinking is much clearer. She also denies any seizures, maybe one in the daytime last February, but otherwise doing very well (previously reporting multiple seizures daily). She is unsure if she has seizures in her sleep, around twice a month she wakes up and feels a little dizzy and that she may have bitten her tongue. No incontinence. She reports difficulties obtaining Seroquel, she is now on Trazodone which does not help as well as the Seroquel. She is taking Lamictal XR 200mg  daily, Depakote ER 1000mg /day and Tegretol XR 800mg /day, with no side effects. No convulsions. She denies any dizziness, diplopia, dysarthria, focal numbness/tingling/weakness, no falls.   02/06/15: Lamictal level 3.9, Depakote level 50.1  HPI: This is a 61 yo RH woman with a history of hypertension, depression, and seizures since age 61 or 35, presenting for worsening memory and continued seizures. She and her sister report seizures started at age 61 or 3, she has "petit" ones and generalized convulsions. The "petit" seizures consist of staring and unresponsiveness with slight shaking of the head lasting 1 minute, sometimes occurring in clusters of 3 ro 4 within a 3 hour interval. They deny any lip smacking, dystonic posturing, or automatisms. She has had urinary incontinence and some tongue bite with these. She knows when she is having one, she feels her head turning to the right but cannot control it,  loses focus, forgetting what she was doing, followed by generalized weakness. She lives alone and reports having the petit ones 2-3 times daily. She denies any generalized convulsions in at least 20 years. Her sister describes right head version with some of the convulsions. She denies any gustatory hallucinations, deja vu, rising epigastric sensation, focal numbness/tingling/weakness. She has had body jerks since childhood. She has told her sister she has smelled "poop" several times.   She and her sister report that she has always had memory problems as a child, but this has greatly increased in the past 20 years. She would say something that didn't happen, or forget things she was supposed to do. Her sister is concerned about how she had given her phone number to strangers and could not recall if she had given them the wrong number. She lives alone but family has stayed in contact with her daily. She would get paranoid if they do not call her. She has been working with her therapist on this, and has a schedule book. She reports that her mood is really good some days, but other days "it's just no good," she sits and is "totally depressed." She can get very angry and violent at times. She denies any suicidal ideation. She takes Seroquel at bedtime.  Epilepsy Risk Factors: Her father and 2 sisters had seizures in childhood. Otherwise she had a normal birth and early development. There is no history of febrile convulsions, CNS infections such as meningitis/encephalitis, significant traumatic brain injury, neurosurgical procedures  Prior AEDs: She recalls trying Dilantin and Phenobarbital when younger.   Diagnostic Data: Routine EEG  was abnormal with occasional focal slowing over the left posterior temporal region, as well as frequent bursts of generalized high voltage irregular 4 Hz spike and wave discharges with frontal predominance lasting 1-4 seconds predominantly in drowsiness and sleep, consistent with  a primary generalized epilepsy.  48-hour EEG showed similar occasional focal slowing over the left temporal region, and frequent generalized 4-5 Hz spike and polyspike and wave discharges lasting 1-5 seconds. There was shifting lead-in noted over the left and right frontal regions, raising the possibility of secondary bisynchrony. She did not write her seizures down in the diary, but reports she had a lot. It is still unclear if the epileptiform discharges lasting more than 3 seconds are symptomatic as she did not write down the times of her seizures. There were no prolonged discharges lasting more than 10 seconds seen.  MRI brain with and without contrast 09/2014 did not show any acute changes, hippocampi symmetric with no abnormal signal or enhancement. There was mild diffuse atrophy, mild chronic microvascular changes noted.   Last levels: 02/06/15 Lamictal 3.9 (on Lamictal 100mg /day), Depakote 50.1  PAST MEDICAL HISTORY: Past Medical History:  Diagnosis Date  . Anxiety   . Asthma   . Depression   . Hyperlipidemia   . Hypertension   . Knee pain   . Seizure ([place])   . Seizures ([place])     MEDICATIONS: Current Outpatient Medications on File Prior to Visit  Medication Sig Dispense Refill  . atorvastatin (LIPITOR) 10 MG tablet Take 1 tablet (10 mg total) by mouth daily. 90 tablet 1  . benzonatate (TESSALON PERLES) 100 MG capsule Take 1 capsule (100 mg total) by mouth 3 (three) times daily as needed for cough. (Patient not taking: Reported on 12/29/2016) 30 capsule 0  . carbamazepine (TEGRETOL XR) 400 MG 12 hr tablet Take 2 tablets every morning 180 tablet 3  . Cholecalciferol (VITAMIN D PO) Take by mouth.    . divalproex (DEPAKOTE ER) 500 MG 24 hr tablet Take 2 tablets at bedtime 180 tablet 3  . gabapentin (NEURONTIN) 300 MG capsule Take 1 capsule (300 mg total) by mouth 2 (two) times daily. 60 capsule 2  . guaiFENesin-codeine 100-10 MG/5ML syrup Take 5 mLs by mouth every 6 (six) hours as  needed for cough. For severe cough, do not drive with this. (Patient not taking: Reported on 12/29/2016) 120 mL 0  . HYDROcodone-acetaminophen (NORCO/VICODIN) 5-325 MG tablet Take 1-2 tablets by mouth every 6 (six) hours as needed. 15 tablet 0  . LamoTRIgine XR 200 MG TB24 Take 1 tablet daily 90 tablet 3  . methocarbamol (ROBAXIN) 500 MG tablet Take 1 tablet (500 mg total) by mouth 2 (two) times daily as needed for muscle spasms. 60 tablet 1  . olmesartan-hydrochlorothiazide (BENICAR HCT) 20-12.5 MG tablet Take 1 tablet by mouth daily. 90 tablet 0  . QUEtiapine (SEROQUEL) 400 MG tablet Take 400 mg by mouth at bedtime.     No current facility-administered medications on file prior to visit.     ALLERGIES: Allergies  Allergen Reactions  . Naproxen Nausea Only    FAMILY HISTORY: Family History  Problem Relation Age of Onset  . Heart disease Mother     SOCIAL HISTORY: Social History   Socioeconomic History  . Marital status: Divorced    Spouse name: Not on file  . Number of children: Not on file  . Years of education: Not on file  . Highest education level: Not on file  Social Needs  .  Financial resource strain: Not on file  . Food insecurity - worry: Not on file  . Food insecurity - inability: Not on file  . Transportation needs - medical: Not on file  . Transportation needs - non-medical: Not on file  Occupational History  . Not on file  Tobacco Use  . Smoking status: Never Smoker  . Smokeless tobacco: Never Used  Substance and Sexual Activity  . Alcohol use: No  . Drug use: No  . Sexual activity: Not on file  Other Topics Concern  . Not on file  Social History Narrative  . Not on file    REVIEW OF SYSTEMS: Constitutional: No fevers, chills, or sweats, no generalized fatigue, change in appetite Eyes: No visual changes, double vision, eye pain Ear, nose and throat: No hearing loss, ear pain, nasal congestion, sore throat Cardiovascular: No chest pain,  palpitations Respiratory:  No shortness of breath at rest or with exertion, wheezes GastrointestinaI: No nausea, vomiting, diarrhea, abdominal pain, fecal incontinence Genitourinary:  No dysuria, urinary retention or frequency Musculoskeletal:  No neck pain, back pain Integumentary: No rash, pruritus, skin lesions Neurological: as above Psychiatric: + depression, insomnia, anxiety Endocrine: No palpitations, fatigue, diaphoresis, mood swings, change in appetite, change in weight, increased thirst Hematologic/Lymphatic:  No anemia, purpura, petechiae. Allergic/Immunologic: no itchy/runny eyes, nasal congestion, recent allergic reactions, rashes  PHYSICAL EXAM: Vitals:   10/21/17 1517  BP: 112/70  Pulse: 81  SpO2: 94%   General: No acute distress, in good spirits Head:  Normocephalic/atraumatic Neck: supple, no paraspinal tenderness, full range of motion Heart:  Regular rate and rhythm Lungs:  Clear to auscultation bilaterally Back: No paraspinal tenderness Skin/Extremities: No rash, no edema Neurological Exam: alert and oriented to person, place, and time. No aphasia or dysarthria. Fund of knowledge is appropriate.  Recent and remote memory are intact. Attention and concentration are normal.    Able to name objects and repeat phrases. Cranial nerves: Pupils equal, round, reactive to light.  Extraocular movements intact with no nystagmus. Visual fields full. Facial sensation intact. No facial asymmetry. Tongue, uvula, palate midline.  Motor: Bulk and tone normal, muscle strength 5/5 throughout with no pronator drift.  Sensation to light touch intact.  No extinction to double simultaneous stimulation.  Deep tendon reflexes 2+ throughout, toes downgoing.  Finger to nose testing intact.  Gait narrow-based and steady, able to tandem walk adequately.  Romberg negative.  IMPRESSION: This is a 61 yo RH woman with a history of seizures since childhood with episodes of staring and unresponsiveness  with head twitch to the right, and infrequent generalized convulsions, no convulsions in at least 20 years. She had presented with worsening memory and multiple daily seizures. 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 MRI brain unremarkable. She had reported a significant improvement in seizure frequency with Lamictal 200mg /day. She was previously reporting multiple daily seizures, today states she has not had any daytime seizures since February 2018. She may have nocturnal seizures, it is unclear, for now we have agreed to continue current medications of Depakote ER 1000mg /day, Tegretol XR 800mg /day, and Lamictal XR 200mg  daily. Refills sent. Continue follow-up with psychiatry and therapy. She does not drive and understands [place] driving laws. She will follow-up in 10 months and knows to call our office for any changes.  Thank you  for allowing me to participate in her care.  Please do not hesitate to call for any questions or concerns.  The duration of this appointment visit was 15 minutes of face-to-face time with the patient.  Greater than 50% of this time was spent in counseling, explanation of diagnosis, planning of further management, and coordination of care.   Amanda Roach, M.D.

## 2017-11-09 ENCOUNTER — Ambulatory Visit: Payer: Medicare Other | Admitting: Neurology

## 2017-11-25 ENCOUNTER — Ambulatory Visit: Payer: Medicare Other | Admitting: Neurology

## 2017-12-12 ENCOUNTER — Encounter (HOSPITAL_COMMUNITY): Payer: Self-pay | Admitting: *Deleted

## 2017-12-12 ENCOUNTER — Ambulatory Visit (HOSPITAL_COMMUNITY)
Admission: EM | Admit: 2017-12-12 | Discharge: 2017-12-12 | Disposition: A | Discharge status: Home / Self Care | Payer: Medicare Other | Attending: Family Medicine | Admitting: Family Medicine

## 2017-12-12 DIAGNOSIS — K0889 Other specified disorders of teeth and supporting structures: Secondary | ICD-10-CM

## 2017-12-12 MED ORDER — HYDROCODONE-ACETAMINOPHEN 5-325 MG PO TABS: 1.0000 | ORAL_TABLET | Freq: Four times a day (QID) | ORAL | 0 refills | Status: DC | PRN

## 2017-12-12 MED ORDER — AMOXICILLIN 875 MG PO TABS: 875.0000 mg | ORAL_TABLET | Freq: Two times a day (BID) | ORAL | 0 refills | Status: DC

## 2017-12-12 NOTE — ED Triage Notes (Signed)
Patient reports dental pain to right side of mouth. States that most severe pain is to right upper mouth, thinks one of her teeth is loose.

## 2017-12-12 NOTE — Discharge Instructions (Signed)
Schedule an appointment with 1 of the dentist below.

## 2017-12-12 NOTE — ED Provider Notes (Signed)
[facility]   [id number] 12/12/17 Arrival Time: 9518   SUBJECTIVE:  Amanda Roach is a 62 y.o. female who presents to the urgent care with complaint of dental pain in tooth #29 for 2 weeks.  It got much worse 3 days prior to arrival.  She does not have a dentist but does have dental insurance.    Past Medical History:  Diagnosis Date  . Anxiety   . Asthma   . Depression   . Hyperlipidemia   . Hypertension   . Knee pain   . Seizure ([place])   . Seizures ([place])    Family History  Problem Relation Age of Onset  . Heart disease Mother    Social History   Socioeconomic History  . Marital status: Divorced    Spouse name: Not on file  . Number of children: Not on file  . Years of education: Not on file  . Highest education level: Not on file  Social Needs  . Financial resource strain: Not on file  . Food insecurity - worry: Not on file  . Food insecurity - inability: Not on file  . Transportation needs - medical: Not on file  . Transportation needs - non-medical: Not on file  Occupational History  . Not on file  Tobacco Use  . Smoking status: Never Smoker  . Smokeless tobacco: Never Used  Substance and Sexual Activity  . Alcohol use: No  . Drug use: No  . Sexual activity: Not on file  Other Topics Concern  . Not on file  Social History Narrative  . Not on file   Current Meds  Medication Sig  . carbamazepine (TEGRETOL XR) 400 MG 12 hr tablet Take 2 tablets every morning  . Cholecalciferol (VITAMIN D PO) Take by mouth.  . divalproex (DEPAKOTE ER) 500 MG 24 hr tablet Take 2 tablets at bedtime  . gabapentin (NEURONTIN) 300 MG capsule Take 1 capsule (300 mg total) by mouth 2 (two) times daily.  Marland Kitchen olmesartan-hydrochlorothiazide (BENICAR HCT) 20-12.5 MG tablet Take 1 tablet by mouth daily.   Allergies  Allergen Reactions  . Naproxen Nausea Only      ROS: As per HPI, remainder of ROS negative.   OBJECTIVE:   Vitals:   12/12/17 1549  BP:  134/71  Pulse: 77  Resp: 17  Temp: 98.4 F (36.9 C)  TempSrc: Oral  SpO2: 100%     General appearance: alert; no distress Eyes: PERRL; EOMI; conjunctiva normal HENT: normocephalic; atraumatic; ; oral mucosa gingivitis around tooth #29 with the tooth filling most of the tooth. Neck: supple Back: no CVA tenderness Extremities: no cyanosis or edema; symmetrical with no gross deformities Skin: warm and dry Neurologic: normal gait; grossly normal Psychological: alert and cooperative; normal mood and affect      Labs:  Results for orders placed or performed in visit on 08/30/16  COMPLETE METABOLIC PANEL WITH GFR  Result Value Ref Range   Sodium 139 135 - 146 mmol/L   Potassium 4.3 3.5 - 5.3 mmol/L   Chloride 103 98 - 110 mmol/L   CO2 29 20 - 31 mmol/L   Glucose, Bld 91 65 - 99 mg/dL   BUN 15 7 - 25 mg/dL   Creat 1.02 0.50 - 1.05 mg/dL   Total Bilirubin 0.3 0.2 - 1.2 mg/dL   Alkaline Phosphatase 70 33 - 130 U/L   AST 11 10 - 35 U/L   ALT 8 6 - 29 U/L   Total Protein  7.2 6.1 - 8.1 g/dL   Albumin 4.0 3.6 - 5.1 g/dL   Calcium 9.5 8.6 - 10.4 mg/dL   GFR, Est African American 71 >=60 mL/min   GFR, Est Non African American 61 >=60 mL/min  Lipid panel  Result Value Ref Range   Cholesterol 159 125 - 200 mg/dL   Triglycerides 73 <150 mg/dL   HDL 73 >=46 mg/dL   Total CHOL/HDL Ratio 2.2 <=5.0 Ratio   VLDL 15 <30 mg/dL   LDL Cholesterol 71 <130 mg/dL    Labs Reviewed - No data to display  No results found.     ASSESSMENT & PLAN:  No diagnosis found.  No orders of the defined types were placed in this encounter.   Reviewed expectations re: course of current medical issues. Questions answered. Outlined signs and symptoms indicating need for more acute intervention. Patient verbalized understanding. After Visit Summary given.    Procedures:      Robyn Haber, MD 12/12/17 [phone]

## 2018-05-24 ENCOUNTER — Telehealth: Payer: Self-pay

## 2018-05-24 NOTE — Telephone Encounter (Signed)
LMOM asking for return call to relay message below.  

## 2018-05-24 NOTE — Telephone Encounter (Signed)
Received message from pharmacy stating that insurance will not cover pt's lamotrigine.  Asking for change.  Please advise.

## 2018-05-24 NOTE — Telephone Encounter (Signed)
Pls let patient know that it looks like her insurance will not cover Lamictal XR, we will have to switch to the immediate-release lamictal 100mg  BID, which she has to take twice a day. Pls confirm if okay with her and send Rx with 11 refills, she has to take this medication. Thanks!

## 2018-05-25 ENCOUNTER — Other Ambulatory Visit: Payer: Self-pay

## 2018-05-25 MED ORDER — LAMOTRIGINE 100 MG PO TABS: 100.0000 mg | ORAL_TABLET | Freq: Two times a day (BID) | ORAL | 11 refills | Status: DC

## 2018-05-25 NOTE — Telephone Encounter (Signed)
Spoke with pt advising her of messages below.  Verified preferred pharmacy.  Rx sent to [facility].

## 2018-08-08 ENCOUNTER — Other Ambulatory Visit: Payer: Self-pay | Admitting: Neurology

## 2018-08-08 DIAGNOSIS — R569 Unspecified convulsions: Secondary | ICD-10-CM

## 2018-08-08 DIAGNOSIS — G40319 Generalized idiopathic epilepsy and epileptic syndromes, intractable, without status epilepticus: Secondary | ICD-10-CM

## 2018-08-21 ENCOUNTER — Ambulatory Visit: Payer: Self-pay | Admitting: Neurology

## 2018-08-30 ENCOUNTER — Other Ambulatory Visit: Payer: Self-pay | Admitting: Neurology

## 2018-08-30 DIAGNOSIS — R569 Unspecified convulsions: Secondary | ICD-10-CM

## 2018-08-30 DIAGNOSIS — G40319 Generalized idiopathic epilepsy and epileptic syndromes, intractable, without status epilepticus: Secondary | ICD-10-CM

## 2018-09-25 ENCOUNTER — Other Ambulatory Visit: Payer: Self-pay | Admitting: Neurology

## 2018-09-25 DIAGNOSIS — G40319 Generalized idiopathic epilepsy and epileptic syndromes, intractable, without status epilepticus: Secondary | ICD-10-CM

## 2018-09-25 DIAGNOSIS — R569 Unspecified convulsions: Secondary | ICD-10-CM

## 2018-10-20 ENCOUNTER — Other Ambulatory Visit: Payer: Self-pay | Admitting: Neurology

## 2018-10-20 DIAGNOSIS — R569 Unspecified convulsions: Secondary | ICD-10-CM

## 2018-10-20 DIAGNOSIS — G40319 Generalized idiopathic epilepsy and epileptic syndromes, intractable, without status epilepticus: Secondary | ICD-10-CM

## 2018-10-25 ENCOUNTER — Telehealth: Payer: Self-pay | Admitting: Neurology

## 2018-10-25 ENCOUNTER — Other Ambulatory Visit: Payer: Self-pay | Admitting: Neurology

## 2018-10-25 DIAGNOSIS — G40319 Generalized idiopathic epilepsy and epileptic syndromes, intractable, without status epilepticus: Secondary | ICD-10-CM

## 2018-10-25 DIAGNOSIS — R569 Unspecified convulsions: Secondary | ICD-10-CM

## 2018-10-25 MED ORDER — LAMOTRIGINE 100 MG PO TABS: 100.0000 mg | ORAL_TABLET | Freq: Two times a day (BID) | ORAL | 6 refills | Status: DC

## 2018-10-25 MED ORDER — DIVALPROEX SODIUM ER 500 MG PO TB24: ORAL_TABLET | ORAL | 6 refills | Status: DC

## 2018-10-25 NOTE — Telephone Encounter (Signed)
Divalproex 500mg  Tab #60 with 6 refills Sig = take 2 tab QHS  Lamotrigine 200mg  Tab #60 with 6 refills Sig = Take 1 tab BID  Sent to [facility] in [place], [place]

## 2018-10-25 NOTE — Telephone Encounter (Signed)
Patient is calling in needing a refill on the divalproex and the Lamotrigine medication sent to the [facility] in [place]. Please send this refill in. Thanks!

## 2018-11-22 ENCOUNTER — Other Ambulatory Visit: Payer: Self-pay | Admitting: Neurology

## 2018-11-22 ENCOUNTER — Telehealth: Payer: Self-pay | Admitting: Neurology

## 2018-11-22 DIAGNOSIS — R569 Unspecified convulsions: Secondary | ICD-10-CM

## 2018-11-22 DIAGNOSIS — G40319 Generalized idiopathic epilepsy and epileptic syndromes, intractable, without status epilepticus: Secondary | ICD-10-CM

## 2018-11-22 NOTE — Telephone Encounter (Signed)
Patient called and is needing a refill on her Carbamazepine medication. She uses [facility]. Thanks

## 2018-11-23 NOTE — Telephone Encounter (Signed)
Rx sent 

## 2018-12-18 ENCOUNTER — Other Ambulatory Visit: Payer: Self-pay | Admitting: Neurology

## 2018-12-18 DIAGNOSIS — R569 Unspecified convulsions: Secondary | ICD-10-CM

## 2018-12-18 DIAGNOSIS — G40319 Generalized idiopathic epilepsy and epileptic syndromes, intractable, without status epilepticus: Secondary | ICD-10-CM

## 2019-01-05 ENCOUNTER — Other Ambulatory Visit: Payer: Self-pay | Admitting: Neurology

## 2019-01-05 DIAGNOSIS — G40319 Generalized idiopathic epilepsy and epileptic syndromes, intractable, without status epilepticus: Secondary | ICD-10-CM

## 2019-01-05 DIAGNOSIS — R569 Unspecified convulsions: Secondary | ICD-10-CM

## 2019-01-12 ENCOUNTER — Other Ambulatory Visit: Payer: Self-pay | Admitting: Neurology

## 2019-01-12 DIAGNOSIS — R569 Unspecified convulsions: Secondary | ICD-10-CM

## 2019-01-12 DIAGNOSIS — G40319 Generalized idiopathic epilepsy and epileptic syndromes, intractable, without status epilepticus: Secondary | ICD-10-CM

## 2019-03-20 ENCOUNTER — Other Ambulatory Visit: Payer: Self-pay | Admitting: Neurology

## 2019-03-20 DIAGNOSIS — R569 Unspecified convulsions: Secondary | ICD-10-CM

## 2019-03-20 DIAGNOSIS — G40319 Generalized idiopathic epilepsy and epileptic syndromes, intractable, without status epilepticus: Secondary | ICD-10-CM

## 2019-06-04 ENCOUNTER — Encounter: Payer: Self-pay | Admitting: Neurology

## 2019-06-04 ENCOUNTER — Ambulatory Visit (INDEPENDENT_AMBULATORY_CARE_PROVIDER_SITE_OTHER): Payer: Medicare Other | Admitting: Neurology

## 2019-06-04 ENCOUNTER — Other Ambulatory Visit: Payer: Self-pay | Admitting: Neurology

## 2019-06-04 ENCOUNTER — Other Ambulatory Visit: Payer: Self-pay

## 2019-06-04 DIAGNOSIS — G40319 Generalized idiopathic epilepsy and epileptic syndromes, intractable, without status epilepticus: Secondary | ICD-10-CM

## 2019-06-04 DIAGNOSIS — R569 Unspecified convulsions: Secondary | ICD-10-CM

## 2019-06-04 MED ORDER — LAMOTRIGINE 100 MG PO TABS: 100.0000 mg | ORAL_TABLET | Freq: Two times a day (BID) | ORAL | 3 refills | Status: DC

## 2019-06-04 MED ORDER — DIVALPROEX SODIUM ER 500 MG PO TB24: ORAL_TABLET | ORAL | 3 refills | Status: AC

## 2019-06-04 MED ORDER — CARBAMAZEPINE ER 400 MG PO TB12: 800.0000 mg | ORAL_TABLET | Freq: Every morning | ORAL | 3 refills | Status: AC

## 2019-06-04 NOTE — Progress Notes (Signed)
NEUROLOGY FOLLOW UP OFFICE NOTE  Amanda Roach [id number]  DOB: 12-Oct-1956  HISTORY OF PRESENT ILLNESS: I had the pleasure of seeing Amanda Roach in follow-up in the neurology clinic on 06/04/2019. She was last seen in December 2018 for seizures and memory loss. EEG consistent with primary generalized epilepsy with frequent 1-5 second bursts of generalized discharges. She moved to [place] a year ago and reports that she is doing very well. She continues to deny any seizures for over a year (was previously reporting multiple seizures daily). She reports mood has been good as well. She continues on Depakote ER 1000mg /day, Tegretol XR 800mg /day, and Lamictal 100mg  BID with no side effects. She denies any gaps in time, staring/unresponsive episodes, olfactory/gustatory hallucinations, myoclonic jerks. She has occasional headaches, no dizziness, diplopia, no falls. She thinks she took her medications twice 2 days ago and feels a little sluggish, she states this is the first time she has done this. She has been having stomach pains after eating and will be seeing her new PCP soon. She states she is not taking Seroquel but is not sure of medications and will call our office to confirm her medication bottles at home.   HPI: This is a 63 yo RH woman with a history of hypertension, depression, and seizures since age 89 or 26, presenting for worsening memory and continued seizures. She and her sister report seizures started at age 35 or 35, she has "petit" ones and generalized convulsions. The "petit" seizures consist of staring and unresponsiveness with slight shaking of the head lasting 1 minute, sometimes occurring in clusters of 3 ro 4 within a 3 hour interval. They deny any lip smacking, dystonic posturing, or automatisms. She has had urinary incontinence and some tongue bite with these. She knows when she is having one, she feels her head turning to the right but cannot control it, loses focus, forgetting  what she was doing, followed by generalized weakness. She lives alone and reports having the petit ones 2-3 times daily. She denies any generalized convulsions in at least 20 years. Her sister describes right head version with some of the convulsions. She denies any gustatory hallucinations, deja vu, rising epigastric sensation, focal numbness/tingling/weakness. She has had body jerks since childhood. She has told her sister she has smelled "poop" several times.   She and her sister report that she has always had memory problems as a child, but this has greatly increased in the past 20 years. She would say something that didn't happen, or forget things she was supposed to do. Her sister is concerned about how she had given her phone number to strangers and could not recall if she had given them the wrong number. She lives alone but family has stayed in contact with her daily. She would get paranoid if they do not call her. She has been working with her therapist on this, and has a schedule book. She reports that her mood is really good some days, but other days "it's just no good," she sits and is "totally depressed." She can get very angry and violent at times. She denies any suicidal ideation. She takes Seroquel at bedtime.  Epilepsy Risk Factors: Her father and 2 sisters had seizures in childhood. Otherwise she had a normal birth and early development. There is no history of febrile convulsions, CNS infections such as meningitis/encephalitis, significant traumatic brain injury, neurosurgical procedures  Prior AEDs: She recalls trying Dilantin and Phenobarbital when younger.   Diagnostic Data:  Routine EEG was abnormal with occasional focal slowing over the left posterior temporal region, as well as frequent bursts of generalized high voltage irregular 4 Hz spike and wave discharges with frontal predominance lasting 1-4 seconds predominantly in drowsiness and sleep, consistent with a primary generalized  epilepsy.  48-hour EEG showed similar occasional focal slowing over the left temporal region, and frequent generalized 4-5 Hz spike and polyspike and wave discharges lasting 1-5 seconds. There was shifting lead-in noted over the left and right frontal regions, raising the possibility of secondary bisynchrony. She did not write her seizures down in the diary, but reports she had a lot. It is still unclear if the epileptiform discharges lasting more than 3 seconds are symptomatic as she did not write down the times of her seizures. There were no prolonged discharges lasting more than 10 seconds seen.  MRI brain with and without contrast 09/2014 did not show any acute changes, hippocampi symmetric with no abnormal signal or enhancement. There was mild diffuse atrophy, mild chronic microvascular changes noted.   Last levels: 02/06/15 Lamictal 3.9 (on Lamictal 100mg /day), Depakote 50.1  PAST MEDICAL HISTORY: Past Medical History:  Diagnosis Date  . Anxiety   . Asthma   . Depression   . Hyperlipidemia   . Hypertension   . Knee pain   . Seizure ([place])   . Seizures ([place])     MEDICATIONS: Current Outpatient Medications on File Prior to Visit  Medication Sig Dispense Refill  . carbamazepine (TEGRETOL XR) 400 MG 12 hr tablet TAKE TWO TABLETS BY MOUTH EVERY MORNING 60 tablet 2  . Cholecalciferol (VITAMIN D PO) Take by mouth.    . divalproex (DEPAKOTE ER) 500 MG 24 hr tablet TAKE TWO TABLETS BY MOUTH EVERY NIGHT AT BEDTIME 60 tablet 3  .      . HYDROcodone-acetaminophen (NORCO/VICODIN) 5-325 MG tablet Take 1 tablet by mouth every 6 (six) hours as needed. 15 tablet 0  . lamoTRIgine (LAMICTAL) 100 MG tablet Take 1 tablet (100 mg total) by mouth 2 (two) times daily. 60 tablet 6  . methocarbamol (ROBAXIN) 500 MG tablet Take 1 tablet (500 mg total) by mouth 2 (two) times daily as needed for muscle spasms. 60 tablet 1  . olmesartan-hydrochlorothiazide (BENICAR HCT) 20-12.5 MG tablet Take 1 tablet by  mouth daily. 90 tablet 0  . QUEtiapine (SEROQUEL) 400 MG tablet Take 400 mg by mouth at bedtime.     No current facility-administered medications on file prior to visit.     ALLERGIES: Allergies  Allergen Reactions  . Naproxen Nausea Only    FAMILY HISTORY: Family History  Problem Relation Age of Onset  . Heart disease Mother     SOCIAL HISTORY: Social History   Socioeconomic History  . Marital status: Divorced    Spouse name: Not on file  . Number of children: Not on file  . Years of education: Not on file  . Highest education level: Not on file  Occupational History  . Not on file  Social Needs  . Financial resource strain: Not on file  . Food insecurity    Worry: Not on file    Inability: Not on file  . Transportation needs    Medical: Not on file    Non-medical: Not on file  Tobacco Use  . Smoking status: Never Smoker  . Smokeless tobacco: Never Used  Substance and Sexual Activity  . Alcohol use: No  . Drug use: No  . Sexual activity: Not on file  Lifestyle  . Physical activity    Days per week: Not on file    Minutes per session: Not on file  . Stress: Not on file  Relationships  . Social Herbalist on phone: Not on file    Gets together: Not on file    Attends religious service: Not on file    Active member of club or organization: Not on file    Attends meetings of clubs or organizations: Not on file    Relationship status: Not on file  . Intimate partner violence    Fear of current or ex partner: Not on file    Emotionally abused: Not on file    Physically abused: Not on file    Forced sexual activity: Not on file  Other Topics Concern  . Not on file  Social History Narrative   Lives alone in two story home      Right handed      Bachelor's Degree    REVIEW OF SYSTEMS: Constitutional: No fevers, chills, or sweats, no generalized fatigue, change in appetite Eyes: No visual changes, double vision, eye pain Ear, nose and  throat: No hearing loss, ear pain, nasal congestion, sore throat Cardiovascular: No chest pain, palpitations Respiratory:  No shortness of breath at rest or with exertion, wheezes GastrointestinaI: No nausea, vomiting, diarrhea, abdominal pain, fecal incontinence Genitourinary:  No dysuria, urinary retention or frequency Musculoskeletal:  No neck pain, back pain Integumentary: No rash, pruritus, skin lesions Neurological: as above Psychiatric: No depression, insomnia, anxiety Endocrine: No palpitations, fatigue, diaphoresis, mood swings, change in appetite, change in weight, increased thirst Hematologic/Lymphatic:  No anemia, purpura, petechiae. Allergic/Immunologic: no itchy/runny eyes, nasal congestion, recent allergic reactions, rashes  PHYSICAL EXAM: Vitals:   06/04/19 1509  BP: (!) 157/99  Pulse: 80  SpO2: 94%   General: No acute distress Head:  Normocephalic/atraumatic Skin/Extremities: No rash, no edema Neurological Exam: alert and oriented to person, place, and time. No aphasia or dysarthria. Fund of knowledge is appropriate.  Recent and remote memory are intact. Attention and concentration are normal.    Able to name objects and repeat phrases. Cranial nerves: Pupils equal, round, reactive to light.  Extraocular movements intact with no nystagmus. Visual fields full. No facial asymmetry. Tongue, uvula, palate midline.  Motor: Bulk and tone normal, muscle strength 5/5 throughout with no pronator drift.  Finger to nose testing intact.  Gait narrow-based and steady, able to tandem walk adequately.  Romberg negative.  IMPRESSION: This is a 63 yo RH woman with a history of seizures since childhood with episodes of staring and unresponsiveness with head twitch to the right, and infrequent generalized convulsions, no convulsions in at least 20 years. She had presented in 2015 with worsening memory and multiple daily seizures. Her routine EEG was consistent with primary generalized  epilepsy, similar findings on 48-hour EEG with no subclinical seizures (discharges lasting more than 10 seconds) seen, however there were frequent 1-5 second bursts, unclear if patient is symptomatic with these. MRI brain unremarkable. She has had significant improvement in seizures with addition of Lamotrigine. She denies any seizures since her last visit in 2018, refills sent for Lamotrigine 100mg  BID, Depakote ER 1000mg /day, Tegretol XR 800mg /day. She would like to continue with Neurology care in [place] but knows to call for any changes. She does not drive.   Thank you for allowing me to participate in her care.  Please do not hesitate to call for any questions or concerns.  Ellouise Newer, M.D.

## 2019-06-04 NOTE — Patient Instructions (Signed)
1. Continue Depakote ER 500mg : Take 2 tablets every night  2. Continue Tegretol XR 400mg : Take 2 tablets every morning  3. Continue Lamictal 100mg : Take 1 tablet twice a day  4. Call our office to confirm the medication bottles you have at home  5. Follow-up with Neurology in [place], I wish you the very best!  Seizure Precautions: 1. If medication has been prescribed for you to prevent seizures, take it exactly as directed.  Do not stop taking the medicine without talking to your doctor first, even if you have not had a seizure in a long time.   2. Avoid activities in which a seizure would cause danger to yourself or to others.  Don't operate dangerous machinery, swim alone, or climb in high or dangerous places, such as on ladders, roofs, or girders.  Do not drive unless your doctor says you may.  3. If you have any warning that you may have a seizure, lay down in a safe place where you can't hurt yourself.    4.  No driving for 6 months from last seizure, as per [facility].   Please refer to the following link on the [place] website for more information: [email or web address]/[email or web address]/[email or web address]   5.  Maintain good sleep hygiene. Avoid alcohol.  6.  Contact your doctor if you have any problems that may be related to the medicine you are taking.  7.  Call 911 and bring the patient back to the ED if:        A.  The seizure lasts longer than 5 minutes.       B.  The patient doesn't awaken shortly after the seizure  C.  The patient has new problems such as difficulty seeing, speaking or moving  D.  The patient was injured during the seizure  E.  The patient has a temperature over 102 F (39C)  F.  The patient vomited and now is having trouble breathing

## 2019-06-15 ENCOUNTER — Telehealth: Payer: Self-pay | Admitting: Neurology

## 2019-06-15 NOTE — Telephone Encounter (Signed)
Pt states that she needs to give the nurse her medication list to update her chart

## 2019-06-15 NOTE — Telephone Encounter (Signed)
Returned patient's call to update current medications in her chart. She is taking the meds Dr. Delice Lesch ordered : Lamictal 100mg  BID; Depakote ER 500mg  2 tabs QHS; Tegretol XR 400 mg 2 tabs in am.   New meds that were not listed that she is now taking is Trazodone 150 mg QHS prn sleep and Ventolin inhaler prn.   Patient said she has an appt next week with a new PCP in [place].

## 2019-06-15 NOTE — Telephone Encounter (Signed)
Great, thanks

## 2020-02-25 ENCOUNTER — Other Ambulatory Visit: Payer: Self-pay

## 2020-03-21 ENCOUNTER — Other Ambulatory Visit: Payer: Self-pay

## 2020-03-21 DIAGNOSIS — G40319 Generalized idiopathic epilepsy and epileptic syndromes, intractable, without status epilepticus: Secondary | ICD-10-CM

## 2020-03-21 MED ORDER — LAMOTRIGINE 100 MG PO TABS: 100.0000 mg | ORAL_TABLET | Freq: Two times a day (BID) | ORAL | 0 refills | Status: DC

## 2020-03-25 MED ORDER — LAMOTRIGINE 100 MG PO TABS: 100.0000 mg | ORAL_TABLET | Freq: Two times a day (BID) | ORAL | 0 refills | Status: AC

## 2020-03-25 NOTE — Addendum Note (Signed)
Addended by: Cameron Sprang on: 03/25/2020 09:40 AM   Modules accepted: Orders

## 2020-06-23 ENCOUNTER — Other Ambulatory Visit: Payer: Self-pay

## 2022-09-06 ENCOUNTER — Encounter: Payer: Self-pay | Admitting: *Deleted

## 2022-09-06 NOTE — Progress Notes (Signed)
This encounter was created in error - please disregard.
